# Patient Record
Sex: Male | Born: 1980 | Hispanic: Yes | Marital: Married | Smoking: Never smoker
Health system: Southern US, Community
[De-identification: ages and names within clinical notes are randomized; demographics above are authoritative.]

## PROBLEM LIST (undated history)

## (undated) DIAGNOSIS — K219 Gastro-esophageal reflux disease without esophagitis: Secondary | ICD-10-CM

## (undated) DIAGNOSIS — M6282 Rhabdomyolysis: Secondary | ICD-10-CM

## (undated) DIAGNOSIS — J189 Pneumonia, unspecified organism: Secondary | ICD-10-CM

## (undated) DIAGNOSIS — Z923 Personal history of irradiation: Secondary | ICD-10-CM

## (undated) DIAGNOSIS — Z9289 Personal history of other medical treatment: Secondary | ICD-10-CM

## (undated) DIAGNOSIS — C719 Malignant neoplasm of brain, unspecified: Secondary | ICD-10-CM

## (undated) DIAGNOSIS — R569 Unspecified convulsions: Secondary | ICD-10-CM

## (undated) DIAGNOSIS — F419 Anxiety disorder, unspecified: Secondary | ICD-10-CM

## (undated) DIAGNOSIS — D496 Neoplasm of unspecified behavior of brain: Secondary | ICD-10-CM

## (undated) HISTORY — DX: Anxiety disorder, unspecified: F41.9

## (undated) HISTORY — DX: Pneumonia, unspecified organism: J18.9

## (undated) HISTORY — DX: Gastro-esophageal reflux disease without esophagitis: K21.9

## (undated) HISTORY — DX: Malignant neoplasm of brain, unspecified: C71.9

## (undated) HISTORY — DX: Rhabdomyolysis: M62.82

## (undated) HISTORY — DX: Unspecified convulsions: R56.9

## (undated) HISTORY — DX: Personal history of other medical treatment: Z92.89

## (undated) HISTORY — DX: Personal history of irradiation: Z92.3

---

## 2010-09-19 DIAGNOSIS — Z9289 Personal history of other medical treatment: Secondary | ICD-10-CM

## 2010-09-19 DIAGNOSIS — R569 Unspecified convulsions: Secondary | ICD-10-CM

## 2010-09-19 HISTORY — DX: Unspecified convulsions: R56.9

## 2010-09-19 HISTORY — DX: Personal history of other medical treatment: Z92.89

## 2010-10-22 DIAGNOSIS — C719 Malignant neoplasm of brain, unspecified: Secondary | ICD-10-CM

## 2010-10-22 HISTORY — PX: BRAIN SURGERY: SHX531

## 2010-10-22 HISTORY — DX: Malignant neoplasm of brain, unspecified: C71.9

## 2012-04-19 DIAGNOSIS — J189 Pneumonia, unspecified organism: Secondary | ICD-10-CM

## 2012-04-19 HISTORY — DX: Pneumonia, unspecified organism: J18.9

## 2012-05-30 HISTORY — PX: CRANIOTOMY: SHX93

## 2012-06-11 ENCOUNTER — Emergency Department (HOSPITAL_COMMUNITY)
Admission: EM | Admit: 2012-06-11 | Discharge: 2012-06-11 | Disposition: A | Discharge status: Nursing Home (Includes ICF) | Payer: PRIVATE HEALTH INSURANCE | Source: Home / Self Care | Attending: Family Medicine | Admitting: Family Medicine

## 2012-06-11 ENCOUNTER — Encounter (HOSPITAL_COMMUNITY): Payer: Self-pay

## 2012-06-11 ENCOUNTER — Emergency Department (HOSPITAL_COMMUNITY)
Admission: EM | Admit: 2012-06-11 | Discharge: 2012-06-11 | Disposition: A | Discharge status: Home / Self Care | Payer: PRIVATE HEALTH INSURANCE | Attending: Emergency Medicine | Admitting: Emergency Medicine

## 2012-06-11 ENCOUNTER — Emergency Department (HOSPITAL_COMMUNITY): Payer: PRIVATE HEALTH INSURANCE

## 2012-06-11 ENCOUNTER — Emergency Department (INDEPENDENT_AMBULATORY_CARE_PROVIDER_SITE_OTHER): Payer: PRIVATE HEALTH INSURANCE

## 2012-06-11 ENCOUNTER — Encounter (HOSPITAL_COMMUNITY): Payer: Self-pay | Admitting: *Deleted

## 2012-06-11 DIAGNOSIS — R109 Unspecified abdominal pain: Secondary | ICD-10-CM | POA: Insufficient documentation

## 2012-06-11 DIAGNOSIS — R1032 Left lower quadrant pain: Secondary | ICD-10-CM

## 2012-06-11 DIAGNOSIS — D72829 Elevated white blood cell count, unspecified: Secondary | ICD-10-CM | POA: Insufficient documentation

## 2012-06-11 HISTORY — DX: Neoplasm of unspecified behavior of brain: D49.6

## 2012-06-11 LAB — CBC WITH DIFFERENTIAL/PLATELET
Basophils Absolute: 0 10*3/uL (ref 0.0–0.1)
Lymphs Abs: 2.2 10*3/uL (ref 0.7–4.0)
MCH: 27.9 pg (ref 26.0–34.0)
MCHC: 33.6 g/dL (ref 30.0–36.0)
MCV: 83.1 fL (ref 78.0–100.0)
Monocytes Absolute: 1.2 10*3/uL — ABNORMAL HIGH (ref 0.1–1.0)
Platelets: 350 10*3/uL (ref 150–400)
RDW: 14 % (ref 11.5–15.5)

## 2012-06-11 LAB — URINALYSIS, ROUTINE W REFLEX MICROSCOPIC
Glucose, UA: NEGATIVE mg/dL
Leukocytes, UA: NEGATIVE
Nitrite: NEGATIVE
Protein, ur: NEGATIVE mg/dL
Urobilinogen, UA: 0.2 mg/dL (ref 0.0–1.0)

## 2012-06-11 LAB — COMPREHENSIVE METABOLIC PANEL
Albumin: 3.6 g/dL (ref 3.5–5.2)
BUN: 18 mg/dL (ref 6–23)
Chloride: 96 mEq/L (ref 96–112)
Creatinine, Ser: 0.79 mg/dL (ref 0.50–1.35)
Total Bilirubin: 0.3 mg/dL (ref 0.3–1.2)

## 2012-06-11 LAB — LIPASE, BLOOD: Lipase: 84 U/L — ABNORMAL HIGH (ref 11–59)

## 2012-06-11 MED ORDER — MORPHINE SULFATE 4 MG/ML IJ SOLN
4.0000 mg | Freq: Once | INTRAMUSCULAR | Status: AC
  Administered 2012-06-11: 4 mg via INTRAVENOUS
  Filled 2012-06-11: qty 1

## 2012-06-11 MED ORDER — SODIUM CHLORIDE 0.9 % IV BOLUS (SEPSIS)
1000.0000 mL | Freq: Once | INTRAVENOUS | Status: AC
  Administered 2012-06-11: 1000 mL via INTRAVENOUS

## 2012-06-11 MED ORDER — ONDANSETRON HCL 4 MG/2ML IJ SOLN
4.0000 mg | Freq: Once | INTRAMUSCULAR | Status: AC
  Administered 2012-06-11: 4 mg via INTRAVENOUS
  Filled 2012-06-11: qty 2

## 2012-06-11 MED ORDER — IOHEXOL 300 MG/ML  SOLN
100.0000 mL | Freq: Once | INTRAMUSCULAR | Status: AC | PRN
  Administered 2012-06-11: 100 mL via INTRAVENOUS

## 2012-06-11 MED ORDER — ONDANSETRON HCL 4 MG PO TABS: 4.0000 mg | ORAL_TABLET | Freq: Four times a day (QID) | ORAL | Status: AC

## 2012-06-11 NOTE — ED Notes (Signed)
Abd pain

## 2012-06-11 NOTE — ED Notes (Signed)
Pt c/o left sided abd pain X 4 days. Pt reports he has been constipated but just took a laxative and was able to have a BM yesterday. Pt also c/o some left sided flank pain.

## 2012-06-11 NOTE — ED Notes (Signed)
Pt ambulated to restroom with no assistance. ?

## 2012-06-11 NOTE — ED Notes (Signed)
Sent from [facility] for abd pain on lower left side of abd, onset 4 days ago. No findings at [facility].

## 2012-06-11 NOTE — ED Notes (Signed)
Pt ambulated with no assistance down the hall. Reports it is painful to put pressure on his right leg.

## 2012-06-11 NOTE — ED Provider Notes (Signed)
History     CSN: [id number]  Arrival date & time 06/11/12  1256   First MD Initiated Contact with Patient 06/11/12 1306      Chief Complaint  Patient presents with  . Abdominal Pain    (Consider location/radiation/quality/duration/timing/severity/associated sxs/prior treatment) Patient is a 31 y.o. male presenting with abdominal pain. The history is provided by the patient and a relative.  Abdominal Pain The primary symptoms of the illness include abdominal pain, nausea and vomiting. The primary symptoms of the illness do not include fever or diarrhea. 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 The onset of the illness was gradual. The problem has been gradually worsening.  The patient has had a change in bowel habit. Additional symptoms associated with the illness include anorexia and constipation. Symptoms associated with the illness do not include back pain.    Past Medical History  Diagnosis Date  . Brain tumor     Past Surgical History  Procedure Date  . Brain surgery     No family history on file.  History  Substance Use Topics  . Smoking status: Not on file  . Smokeless tobacco: Not on file  . Alcohol Use:       Review of Systems  Constitutional: Positive for appetite change. Negative for fever.  Gastrointestinal: Positive for nausea, vomiting, abdominal pain, constipation and anorexia. Negative for diarrhea.  Genitourinary: Negative.   Musculoskeletal: Negative for back pain.    Allergies  Dilantin  Home Medications   Current Outpatient Rx  Name Route Sig Dispense Refill  . ACETAMINOPHEN PO Oral Take by mouth.    . BUTABARBITAL SODIUM PO Oral Take by mouth.    Marland Kitchen CIPRO PO Oral Take by mouth.    . DEXAMETHASONE (PAK) PO Oral Take by mouth.    Marland Kitchen KEPPRA PO Oral Take by mouth.    . OXYCODONE HCL PO Oral Take by mouth.    Marland Kitchen PANTOPRAZOLE SODIUM 40 MG IV  SOLR Intravenous Inject 40 mg into the vein.    . SENNA CO Combination by Combination route.      BP 130/97  Pulse 82  Temp 98.6 F (37 C) (Oral)  Resp 18  SpO2 100%  Physical Exam  Nursing note and vitals reviewed. Constitutional: He is oriented to person, place, and time. He appears well-developed and well-nourished.  Pulmonary/Chest: Breath sounds normal.  Abdominal: Soft. Bowel sounds are normal. He exhibits no distension and no mass. There is no hepatosplenomegaly. There is tenderness in the left upper quadrant and left lower quadrant. There is no rigidity, no rebound, no guarding and no CVA tenderness.  Neurological: He is alert and oriented to person, place, and time.  Skin: Skin is warm and dry.    ED Course  Procedures (including critical care time)  Labs Reviewed - No data to display Dg Abd 1 View  06/11/2012  *RADIOLOGY REPORT*  Clinical Data: Left abdominal pain for 4 days.  Recent brain surgery.  ABDOMEN - 1 VIEW  Comparison: None.  Findings: The bowel gas pattern appears nonobstructive.  There is mildly prominent stool within the right colon.  There is no supine evidence of free intraperitoneal air.  There is a small right pelvic calcification which is likely phleboliths.  Osseous structures appear normal.  IMPRESSION: No acute abdominal findings.   Original Report Authenticated By: Gerrianne Scale, M.D.  1. Abdominal pain, left lower quadrant       MDM  X-rays reviewed and report per radiologist.         Linna Hoff, MD 06/11/12 [phone]

## 2012-06-11 NOTE — ED Notes (Signed)
Pt  Reports   Symptoms  Of  abd  Pain      X  3 days     He  Has  Been  Taking  Oxycodone  For  ghis  Post op  Pain  -  He  Has  Had  Some  Constipation as  Of  Late       And  Has  Been taking some  Laxatives         He  Called  His   Surgeon who  Told  Them to  Get an x  Ray of  His  abd

## 2012-06-11 NOTE — ED Provider Notes (Signed)
History     CSN: [id number]  Arrival date & time 06/11/12  1451   First MD Initiated Contact with Patient 06/11/12 1903      Chief Complaint  Patient presents with  . Abdominal Pain    (Consider location/radiation/quality/duration/timing/severity/associated sxs/prior treatment) HPI  31 year old male who was sent from urgent care for further evaluation of his abdominal pain. Patient endorse a gradual onset of left side abdominal pain for the past 4 days. Pain is described as cramping, persistent, radiates to back.  Nothing aggravates or alleviate sxs.   He endorsed nausea, vomiting, and constipation. Has been using stool softener and were able to produce normal BM yesterday.  Has hx of acid reflux but sts this pain felt different.  Denies fever, headache, cp, sob, urinary sxs or rash.  No prior abd surgery.  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    Past Medical History  Diagnosis Date  . Brain tumor     Past Surgical History  Procedure Date  . Brain surgery     No family history on file.  History  Substance Use Topics  . Smoking status: Not on file  . Smokeless tobacco: Not on file  . Alcohol Use:       Review of Systems  All other systems reviewed and are negative.    Allergies  Dilantin  Home Medications   Current Outpatient Rx  Name Route Sig Dispense Refill  . ACETAMINOPHEN PO Oral Take by mouth.    . BUTABARBITAL SODIUM PO Oral Take by mouth.    Marland Kitchen CIPRO PO Oral Take by mouth.    . DEXAMETHASONE (PAK) PO Oral Take by mouth.    Marland Kitchen KEPPRA PO Oral Take by mouth.    . OXYCODONE HCL PO Oral Take by mouth.    Marland Kitchen PANTOPRAZOLE SODIUM 40 MG IV SOLR Intravenous Inject 40 mg into the vein.    . SENNA CO Combination by Combination route.      BP 134/99  Pulse 90  Temp 99.5 F (37.5 C) (Oral)  Resp 16  SpO2 96%  Physical Exam  Nursing note and vitals reviewed. Constitutional: He is oriented to person, place, and time. He  appears well-developed and well-nourished. No distress.       Awake, alert, nontoxic appearance  HENT:  Head: Normocephalic.       Well healing surgical scar along scalp line anteriorly, no evidence of infection, sutures in place  Faint ecchymosis behind R ear (battle sign), nontender  Eyes: Conjunctivae and EOM are normal. Pupils are equal, round, and reactive to light. Right eye exhibits no discharge. Left eye exhibits no discharge.  Neck: Normal range of motion. Neck supple.  Cardiovascular: Normal rate and regular rhythm.   Pulmonary/Chest: Effort normal. No respiratory distress. He exhibits no tenderness.  Abdominal: Soft. There is tenderness. There is no rigidity, no rebound, no guarding, no CVA tenderness, no tenderness at McBurney's point and negative Murphy's sign. No hernia.    Musculoskeletal: He exhibits no tenderness.       Cervical back: Normal.       Thoracic back: Normal.       Lumbar back: Normal.       Back:       ROM appears intact, no obvious focal weakness.  No midline spine tenderness, no step-off, no rash or overlying skin changes.  Lymphadenopathy:    He has no cervical adenopathy.  Neurological: He is alert and oriented to  person, place, and time. He has normal strength. No cranial nerve deficit or sensory deficit. He exhibits normal muscle tone. He displays a negative Romberg sign. Coordination and gait normal. GCS eye subscore is 4. GCS verbal subscore is 5. GCS motor subscore is 6.  Reflex Scores:      Patellar reflexes are 2+ on the right side and 2+ on the left side. Skin: Skin is warm and dry. No rash noted.  Psychiatric: He has a normal mood and affect.    ED Course  Procedures (including critical care time)  Labs Reviewed  CBC WITH DIFFERENTIAL - Abnormal; Notable for the following:    WBC 19.9 (*)     Neutrophils Relative 83 (*)     Lymphocytes Relative 11 (*)     Neutro Abs 16.5 (*)     Monocytes Absolute 1.2 (*)     All other components  within normal limits  COMPREHENSIVE METABOLIC PANEL  URINALYSIS, ROUTINE W REFLEX MICROSCOPIC   Dg Abd 1 View  06/11/2012  *RADIOLOGY REPORT*  Clinical Data: Left abdominal pain for 4 days.  Recent brain surgery.  ABDOMEN - 1 VIEW  Comparison: None.  Findings: The bowel gas pattern appears nonobstructive.  There is mildly prominent stool within the right colon.  There is no supine evidence of free intraperitoneal air.  There is a small right pelvic calcification which is likely phleboliths.  Osseous structures appear normal.  IMPRESSION: No acute abdominal findings.   Original Report Authenticated By: Gerrianne Scale, M.D.      No diagnosis found.  Results for orders placed during the hospital encounter of 06/11/12  CBC WITH DIFFERENTIAL      Component Value Range   WBC 19.9 (*) 4.0 - 10.5 K/uL   RBC 5.27  4.22 - 5.81 MIL/uL   Hemoglobin 14.7  13.0 - 17.0 g/dL   HCT 16.1  09.6 - 04.5 %   MCV 83.1  78.0 - 100.0 fL   MCH 27.9  26.0 - 34.0 pg   MCHC 33.6  30.0 - 36.0 g/dL   RDW 40.9  81.1 - 91.4 %   Platelets 350  150 - 400 K/uL   Neutrophils Relative 83 (*) 43 - 77 %   Lymphocytes Relative 11 (*) 12 - 46 %   Monocytes Relative 6  3 - 12 %   Eosinophils Relative 0  0 - 5 %   Basophils Relative 0  0 - 1 %   Neutro Abs 16.5 (*) 1.7 - 7.7 K/uL   Lymphs Abs 2.2  0.7 - 4.0 K/uL   Monocytes Absolute 1.2 (*) 0.1 - 1.0 K/uL   Eosinophils Absolute 0.0  0.0 - 0.7 K/uL   Basophils Absolute 0.0  0.0 - 0.1 K/uL   RBC Morphology POLYCHROMASIA PRESENT     WBC Morphology MILD LEFT SHIFT (1-5% METAS, OCC MYELO, OCC BANDS)    COMPREHENSIVE METABOLIC PANEL      Component Value Range   Sodium 135  135 - 145 mEq/L   Potassium 4.3  3.5 - 5.1 mEq/L   Chloride 96  96 - 112 mEq/L   CO2 29  19 - 32 mEq/L   Glucose, Bld 98  70 - 99 mg/dL   BUN 18  6 - 23 mg/dL   Creatinine, Ser 7.82  0.50 - 1.35 mg/dL   Calcium 9.1  8.4 - 95.6 mg/dL   Total Protein 7.1  6.0 - 8.3 g/dL   Albumin 3.6  3.5 - 5.2  g/dL     AST 18  0 - 37 U/L   ALT 38  0 - 53 U/L   Alkaline Phosphatase 76  39 - 117 U/L   Total Bilirubin 0.3  0.3 - 1.2 mg/dL   GFR calc non Af Amer >90  >90 mL/min   GFR calc Af Amer >90  >90 mL/min  URINALYSIS, ROUTINE W REFLEX MICROSCOPIC      Component Value Range   Color, Urine YELLOW  YELLOW   APPearance CLEAR  CLEAR   Specific Gravity, Urine 1.018  1.005 - 1.030   pH 5.5  5.0 - 8.0   Glucose, UA NEGATIVE  NEGATIVE mg/dL   Hgb urine dipstick NEGATIVE  NEGATIVE   Bilirubin Urine NEGATIVE  NEGATIVE   Ketones, ur NEGATIVE  NEGATIVE mg/dL   Protein, ur NEGATIVE  NEGATIVE mg/dL   Urobilinogen, UA 0.2  0.0 - 1.0 mg/dL   Nitrite NEGATIVE  NEGATIVE   Leukocytes, UA NEGATIVE  NEGATIVE  LIPASE, BLOOD      Component Value Range   Lipase 84 (*) 11 - 59 U/L   Dg Abd 1 View  06/11/2012  *RADIOLOGY REPORT*  Clinical Data: Left abdominal pain for 4 days.  Recent brain surgery.  ABDOMEN - 1 VIEW  Comparison: None.  Findings: The bowel gas pattern appears nonobstructive.  There is mildly prominent stool within the right colon.  There is no supine evidence of free intraperitoneal air.  There is a small right pelvic calcification which is likely phleboliths.  Osseous structures appear normal.  IMPRESSION: No acute abdominal findings.   Original Report Authenticated By: Gerrianne Scale, M.D.    Ct Abdomen Pelvis W Contrast  06/11/2012  *RADIOLOGY REPORT*  Clinical Data: Left-sided abdominal pain.  CT ABDOMEN AND PELVIS WITH CONTRAST  Technique:  Multidetector CT imaging of the abdomen and pelvis was performed following the standard protocol during bolus administration of intravenous contrast.  Contrast: OMNIPAQUE IOHEXOL 300 MG/ML  SOLN  Comparison: 06/11/2012 radiograph  Findings: Limited images through the lung bases demonstrate trace pericardial fluid versus thickening.  Normal heart size.  Lung bases are clear.  Unremarkable liver, biliary system, spleen, pancreas, adrenal glands.  Symmetric  renal enhancement.  Minimal left perinephric fat stranding.  No hydronephrosis or hydroureter.  No ureteral calculi identified.  No bowel obstruction.  No CT evidence for colitis.  Normal appendix.  No free intraperitoneal air or fluid.  Mild right iliac artery atherosclerosis.  Normal caliber aorta.  Thin-walled bladder.  Soft tissue attenuation within the inguinal canals presumably represent high riding testicles.  Correlate with physical examination.  No acute osseous finding. Small fat containing anterior abdominal wall/periumbilical hernia.  Impression:  Minimal left perinephric fat stranding.  Correlate with urinalysis if concerned for ascending infection. Alternatively, this may reflect sequelae of prior inflammation (such as from a previously passed stone).  Despite the elevated lipase, homogeneous pancreatic enhancement, without CT evidence of acute inflammation.  Soft tissue attenuation within the inguinal canals may correspond to high-riding testicles.  Correlate with physical examination.   Original Report Authenticated By: Waneta Martins, M.D.     1. Leukocytosis 2. abd pain 2/2 recently passed L side kidney stone  MDM  Date left-sided abdominal discomfort x4 days.  Abdominal exam is unremarkable.  Pt has no focal neuro deficits.  White count is elevated at 19.9 with a left shift. However patient has a recent surgical procedure (brain surgery) 10 days ago so unsure if leukocytosis is related  to previous surgery. Plan to obtain abd/pelvic CT scan for further evaluation.  Pain medication and antinausea medication given.    10:27 PM Abd/pelvic CT shows no acute finding.  There is evidence of minimal left perinephric fat stranding, which may reflect sequelae of prior inflammation from a previously passed stone.  Pt did report he has a 2.5 mm stone in L kidney which were found on previous ct scan.  I believe his presenting complaint is related to a recently passed stone.  Reassurance given.  I  recommend f/u with his doctor for further care, and to return if his sxs worsen.  Pt voice understanding and agrees with plan.  Pt able to tolerates PO.  Will d/c.    Fayrene Helper, PA-C 06/11/12 2237

## 2012-06-13 NOTE — ED Provider Notes (Signed)
Medical screening examination/treatment/procedure(s) were performed by non-physician practitioner and as supervising physician I was immediately available for consultation/collaboration.  Tobin Chad, MD 06/13/12 [phone]

## 2012-06-25 ENCOUNTER — Encounter: Payer: Self-pay | Admitting: *Deleted

## 2012-06-25 ENCOUNTER — Telehealth: Payer: Self-pay | Admitting: *Deleted

## 2012-06-25 DIAGNOSIS — J189 Pneumonia, unspecified organism: Secondary | ICD-10-CM | POA: Insufficient documentation

## 2012-06-25 NOTE — Telephone Encounter (Signed)
Pt;s brother called wanting to make an appt with Dr Donnald Garre on Monday.  Pt is not established with Dr Arbutus Ped at this time.  Informed him that he would need to be referred to Dr [facility] if medical oncology is appropriate.  He verbalized understanding.  SLJ

## 2012-06-28 ENCOUNTER — Encounter: Payer: Self-pay | Admitting: Radiation Oncology

## 2012-06-28 ENCOUNTER — Ambulatory Visit
Admission: RE | Admit: 2012-06-28 | Discharge: 2012-06-28 | Disposition: A | Discharge status: Home / Self Care | Payer: PRIVATE HEALTH INSURANCE | Source: Ambulatory Visit | Attending: Radiation Oncology | Admitting: Radiation Oncology

## 2012-06-28 ENCOUNTER — Telehealth: Payer: Self-pay | Admitting: Radiation Oncology

## 2012-06-28 ENCOUNTER — Ambulatory Visit: Payer: PRIVATE HEALTH INSURANCE | Admitting: Radiation Oncology

## 2012-06-28 VITALS — BP 133/90 | HR 80 | Temp 98.1°F | Resp 20 | Wt 192.9 lb

## 2012-06-28 DIAGNOSIS — C719 Malignant neoplasm of brain, unspecified: Secondary | ICD-10-CM | POA: Insufficient documentation

## 2012-06-28 DIAGNOSIS — Z79899 Other long term (current) drug therapy: Secondary | ICD-10-CM | POA: Insufficient documentation

## 2012-06-28 DIAGNOSIS — D72829 Elevated white blood cell count, unspecified: Secondary | ICD-10-CM | POA: Insufficient documentation

## 2012-06-28 DIAGNOSIS — R51 Headache: Secondary | ICD-10-CM | POA: Insufficient documentation

## 2012-06-28 DIAGNOSIS — K219 Gastro-esophageal reflux disease without esophagitis: Secondary | ICD-10-CM | POA: Insufficient documentation

## 2012-06-28 DIAGNOSIS — Z510 Encounter for antineoplastic radiation therapy: Secondary | ICD-10-CM | POA: Insufficient documentation

## 2012-06-28 DIAGNOSIS — C711 Malignant neoplasm of frontal lobe: Secondary | ICD-10-CM | POA: Insufficient documentation

## 2012-06-28 HISTORY — DX: Malignant neoplasm of brain, unspecified: C71.9

## 2012-06-28 LAB — CBC WITH DIFFERENTIAL/PLATELET
BASO%: 0.5 % (ref 0.0–2.0)
EOS%: 1.3 % (ref 0.0–7.0)
HGB: 13.3 g/dL (ref 13.0–17.1)
MCH: 28.4 pg (ref 27.2–33.4)
MCHC: 33.9 g/dL (ref 32.0–36.0)
RBC: 4.68 10*6/uL (ref 4.20–5.82)
RDW: 14.4 % (ref 11.0–14.6)
lymph#: 3.4 10*3/uL — ABNORMAL HIGH (ref 0.9–3.3)

## 2012-06-28 NOTE — Telephone Encounter (Signed)
CORRECTION ON EPP VALID DATES   APPROVED 100%  Family Size: 2  HH INC: 0  MOD POV: 11,490.00  Valid Dates: 06/28/2012 - 12/26/2012

## 2012-06-28 NOTE — Progress Notes (Signed)
Pt denies headache, has occasional nausea but not daily, is fatigued and rests majority of day. Denies vision problems, loss of appetite. Pt stopped Dexamethasone 06/11/12, states he had missed a few doses so dr stopped med. Pt occasionally has low mid back pain that radiates down his right leg, describes as dull, achy. Takes Oxy-IR and Ibuprofen prn. Pt seeing surgeon tomorrow to have sticthes removed from front of scalp. Area clean, dry, intact.

## 2012-06-28 NOTE — Progress Notes (Signed)
Please see the Nurse Progress Note in the MD Initial Consult Encounter for this patient. 

## 2012-06-28 NOTE — Telephone Encounter (Signed)
Met with patient and his brother today in regards to [employer] Pt is married [place]), not a [place] Citizen and has no valid SSN. Pt will not qualify for MCD at this time.  Pt completed the EPP application with letter of support from his brother Ignacia Felling [place]).  APPROVED 100% Family Size: 2 HH INC: 0 MOD POV: 11,490.00 Valid Dates: 06/28/2012 - 06/28/2013 *PLEASE APPLY DISCOUNT TO ANY PRIOR AND ALL CURRENT BILL DOS.  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 Kathrynn Running  Rad Tx:

## 2012-06-28 NOTE — Progress Notes (Signed)
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  He underwent brain surgery on 10/22/2010 in [place] for resection of a right frontal grade II astrocytoma. In follow-up.  He relocated to the [place] to visit family and began experiencing some headaches.  He was seen at [facility] in [place].  A brain MRI on 04/05/12 demonstrated a 5.0 x 5.0 cm region of T2 hyperintensity.  He elected to see the brain tumor team at [facility].  He underwent BrainLab image guidance MRI with fiducials on 8/11 and image-guided right frontal craniotomy on 05/31/2012 with Dr. Lavonna Monarch at [facility] and resection was performed.  Post-operative imaging included a non-contrast head CT performed on 8/16 for headaches, which showed the resection cavity.   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  Following hospital discharge, the patient developed acute abdominal pain and presented to the [facility] emergency room. Abdominal CT at that time showed no overt etiology other than some minimal left perinephric fat stranding.  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 He was given a two-week course of antibiotics and antifungal therapy. The patient has, been referred today for discussion of potential postoperative radiation treatment.   PREVIOUS RADIATION THERAPY: No  PAST MEDICAL HISTORY:  has a past medical history of Brain tumor; Brain cancer (10/22/10); Pneumonia (04/2012); Seizures (09/2010);  Rhabdomyolysis; History of hemodialysis (09/2010); GERD (gastroesophageal reflux disease); and Anxiety.    PAST SURGICAL HISTORY: Past Surgical History  Procedure Date  . Brain surgery 10/22/2010    [place]  . Craniotomy 05/30/12    Dr Lavonna Monarch, [facility]    FAMILY HISTORY: family history includes Diabetes in his father and mother; Hyperlipidemia in his mother; and Hypertension in his father and mother.  SOCIAL HISTORY:  reports that he has never smoked. He does not have any smokeless tobacco history on file. He reports that he does not drink alcohol or use illicit drugs.  ALLERGIES: Dilantin  MEDICATIONS:  Current Outpatient Prescriptions  Medication Sig Dispense Refill  . acetaminophen (TYLENOL) 650 MG CR tablet Take 650 mg by mouth every 4 (four) hours as needed. For pain/fever      . bacitracin ointment Apply 1 application topically daily.      . butalbital-acetaminophen-caffeine (FIORICET) 50-325-40 MG per tablet Take 1 tablet by mouth every 4 (four) hours as needed. For pain      . ciprofloxacin (CIPRO) 750 MG tablet Take 750 mg by mouth every 12 (twelve) hours.      Marland Kitchen dexamethasone (DECADRON) 4 MG tablet Take 4 mg by mouth See admin instructions. Take 1 tab 4 times daily for 5 days; then 1 tab 3 times daily for 1 day; then 1 tab daily 2 times daily for 1 day; then 0.5 half tab 2 times daily for 1 day; then 0.5 half tab once daily for 1 day; then stop; Start date 06/01/12      . levETIRAcetam (KEPPRA) 500 MG tablet Take 500-1,000 mg by mouth 2 (two) times daily. 2 tabs  2 times daily for 10 days, Start date 06/01/12; If no seizures decrease to 1 tab 2 times daily until follow up appt      . ondansetron (ZOFRAN) 4 MG tablet Take 4 mg by mouth every 8 (eight) hours as needed.      Marland Kitchen oxyCODONE (OXY IR/ROXICODONE) 5 MG immediate release tablet Take 5 mg by mouth every 4 (four) hours as needed. For pain      . pantoprazole (PROTONIX) 40 MG tablet Take 40 mg by mouth daily.      Marland Kitchen  senna-docusate (SENOKOT-S) 8.6-50 MG per tablet Take 2 tablets by mouth 2 (two) times daily.      Marland Kitchen voriconazole (VFEND) 200 MG tablet Take 200 mg by mouth every 12 (twelve) hours.        REVIEW OF SYSTEMS:  A 15 point review of systems is documented in the electronic medical record. This was obtained by the nursing staff. However, I reviewed this with the patient to discuss relevant findings and make appropriate changes.  A comprehensive review of systems was negative.   PHYSICAL EXAM: The patient is in no acute distress today. Is alert and oriented.  Filed Vitals:   06/28/12 0915  BP: 133/90  Pulse: 80  Temp: 98.1 F (36.7 C)  Resp: 20  His head is normocephalic with a recent right frontal craniotomy incision which is well attended. Extraocular muscles are intact motor strength is intact throughout. Speech is fluent articulate gait is unremarkable   LABORATORY DATA:  Lab Results  Component Value Date   WBC 19.9* 06/11/2012   HGB 14.7 06/11/2012   HCT 43.8 06/11/2012   MCV 83.1 06/11/2012   PLT 350 06/11/2012   Lab Results  Component Value Date   NA 135 06/11/2012   K 4.3 06/11/2012   CL 96 06/11/2012   CO2 29 06/11/2012   Lab Results  Component Value Date   ALT 38 06/11/2012   AST 18 06/11/2012   ALKPHOS 76 06/11/2012   BILITOT 0.3 06/11/2012     RADIOGRAPHY: Dg Abd 1 View  06/11/2012  *RADIOLOGY REPORT*  Clinical Data: Left abdominal pain for 4 days.  Recent brain surgery.  ABDOMEN - 1 VIEW  Comparison: None.  Findings: The bowel gas pattern appears nonobstructive.  There is mildly prominent stool within the right colon.  There is no supine evidence of free intraperitoneal air.  There is a small right pelvic calcification which is likely phleboliths.  Osseous structures appear normal.  IMPRESSION: No acute abdominal findings.   Original Report Authenticated By: Gerrianne Scale, M.D.    Ct Abdomen Pelvis W Contrast  06/11/2012  *RADIOLOGY REPORT*  Clinical Data: Left-sided  abdominal pain.  CT ABDOMEN AND PELVIS WITH CONTRAST  Technique:  Multidetector CT imaging of the abdomen and pelvis was performed following the standard protocol during bolus administration of intravenous contrast.  Contrast: OMNIPAQUE IOHEXOL 300 MG/ML  SOLN  Comparison: 06/11/2012 radiograph  Findings: Limited images through the lung bases demonstrate trace pericardial fluid versus thickening.  Normal heart size.  Lung bases are clear.  Unremarkable liver, biliary system, spleen, pancreas, adrenal glands.  Symmetric renal enhancement.  Minimal left perinephric fat stranding.  No hydronephrosis or hydroureter.  No ureteral calculi identified.  No bowel obstruction.  No CT evidence for colitis.  Normal appendix.  No free intraperitoneal air or fluid.  Mild right iliac artery atherosclerosis.  Normal caliber aorta.  Thin-walled bladder.  Soft tissue attenuation within the inguinal canals presumably  represent high riding testicles.  Correlate with physical examination.  No acute osseous finding. Small fat containing anterior abdominal wall/periumbilical hernia.  Impression:  Minimal left perinephric fat stranding.  Correlate with urinalysis if concerned for ascending infection. Alternatively, this may reflect sequelae of prior inflammation (such as from a previously passed stone).  Despite the elevated lipase, homogeneous pancreatic enhancement, without CT evidence of acute inflammation.  Soft tissue attenuation within the inguinal canals may correspond to high-riding testicles.  Correlate with physical examination.   Original Report Authenticated By: Waneta Martins, M.D.       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  He would benefit from adjuvant radiotherapy with concurrent Temodar.  Plan: Today, talked the patient about his recent surgery and potential treatment options. We talked about the natural history of brain  tumors we talked about the role of surgery and radiation. We also talked briefly about the role of concurrent Temodar chemotherapy. We discussed the logistics and delivery of radiotherapy. We talked about the anticipated acute and late sequelae. I filled out a patient counseling form for him with relevance treatment diagrams and we retained a copy for our records. The patient is interested in proceeding with adjuvant radiation as discussed.  I will work on scheduling CT simulation as well as consultation with medical oncology. I spent 60 minutes minutes face to face with the patient and more than 50% of that time was spent in counseling and/or coordination of care.   ------------------------------------------------  Artist Pais. Kathrynn Running, M.D.

## 2012-06-28 NOTE — Progress Notes (Signed)
Quick Note:  Please call patient with normal result.  Thanks. MM ______ 

## 2012-06-29 ENCOUNTER — Encounter: Payer: Self-pay | Admitting: Medical Oncology

## 2012-06-29 ENCOUNTER — Telehealth: Payer: Self-pay | Admitting: *Deleted

## 2012-06-29 NOTE — Addendum Note (Signed)
Encounter addended by: Glennie Hawk, RN on: 06/29/2012 10:07 AM<BR>     Documentation filed: Charges VN

## 2012-06-29 NOTE — Telephone Encounter (Signed)
Spoke w/pt's brother; corrected home phone # in [place]. Gave him verbal per Dr Kathrynn Running that pt's CBC was normal. Per his request, faxed lab result to Dr Pecola Lawless, [facility], [phone].

## 2012-06-30 ENCOUNTER — Other Ambulatory Visit: Payer: Self-pay | Admitting: Radiation Therapy

## 2012-06-30 ENCOUNTER — Telehealth: Payer: Self-pay | Admitting: Oncology

## 2012-06-30 ENCOUNTER — Ambulatory Visit (HOSPITAL_COMMUNITY)
Admission: RE | Admit: 2012-06-30 | Discharge: 2012-06-30 | Disposition: A | Discharge status: Home / Self Care | Payer: PRIVATE HEALTH INSURANCE | Source: Ambulatory Visit | Attending: Radiation Oncology | Admitting: Radiation Oncology

## 2012-06-30 ENCOUNTER — Telehealth: Payer: Self-pay | Admitting: *Deleted

## 2012-06-30 DIAGNOSIS — C719 Malignant neoplasm of brain, unspecified: Secondary | ICD-10-CM

## 2012-06-30 DIAGNOSIS — G936 Cerebral edema: Secondary | ICD-10-CM | POA: Insufficient documentation

## 2012-06-30 MED ORDER — GADOBENATE DIMEGLUMINE 529 MG/ML IV SOLN
18.0000 mL | Freq: Once | INTRAVENOUS | Status: AC | PRN
  Administered 2012-06-30: 18 mL via INTRAVENOUS

## 2012-06-30 NOTE — Telephone Encounter (Signed)
CALLED PATIENT TO INFORM OF TEST FOR 06-30-12 AT 9:00 PM, SPOKE WITH PATIENT'S BROTHER AND THEY ARE AWARE OF THIS TEST

## 2012-06-30 NOTE — Telephone Encounter (Signed)
S/w the pt's brother and he is aware of the appt on 07/01/2012

## 2012-07-01 ENCOUNTER — Ambulatory Visit: Payer: PRIVATE HEALTH INSURANCE

## 2012-07-01 ENCOUNTER — Ambulatory Visit: Payer: PRIVATE HEALTH INSURANCE | Admitting: Oncology

## 2012-07-02 ENCOUNTER — Encounter: Payer: Self-pay | Admitting: Radiation Oncology

## 2012-07-02 ENCOUNTER — Ambulatory Visit
Admission: RE | Admit: 2012-07-02 | Discharge: 2012-07-02 | Disposition: A | Discharge status: Home / Self Care | Payer: PRIVATE HEALTH INSURANCE | Source: Ambulatory Visit | Attending: Radiation Oncology | Admitting: Radiation Oncology

## 2012-07-02 DIAGNOSIS — C719 Malignant neoplasm of brain, unspecified: Secondary | ICD-10-CM

## 2012-07-04 ENCOUNTER — Encounter: Payer: Self-pay | Admitting: Radiation Oncology

## 2012-07-04 NOTE — Progress Notes (Addendum)
  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  Identity was confirmed.  All relevant records and images related to the planned course of therapy were reviewed.  The patient freely provided informed written consent to proceed with treatment after reviewing the details related to the planned course of therapy. The consent form was witnessed and verified by the simulation staff.  Then, the patient was set-up in a stable reproducible  supine position for radiation therapy.  CT images were obtained.  Surface markings were placed.  The CT images were loaded into the planning software.  Then the target and avoidance structures were contoured.  Treatment planning then occurred.  The radiation prescription was entered and confirmed.  A total of 1 complex treatment device was fabricated. I have requested : 3-D conformal radiotherapy planning using dynamic conformal arcs. Specifically, will require dose volume histograms for the target structures, brainstem, optic chiasm, optic nerve and uninvolved brain.  PLAN:  The patient will receive 59.4 Gy in 33 fractions.  ________________________________  Artist Pais Kathrynn Running, M.D.   Addendum: In reviewing this patient's planning MRI and CT, it appears that his surgical cavity in the right frontal lobe includes the frontal convexity overlying the right orbit. This brings the target structure into very close proximity with the right eye and optic nerve. This geometry cannot possibly be treated with 3-D conformal radiation with adequate coverage of the target volume while sparing the optic structures appropriately. Accordingly, the patient will require intensity  modulated radiotherapy and have requested a rapid ARC plan.

## 2012-07-06 ENCOUNTER — Other Ambulatory Visit (HOSPITAL_BASED_OUTPATIENT_CLINIC_OR_DEPARTMENT_OTHER): Payer: PRIVATE HEALTH INSURANCE | Admitting: Lab

## 2012-07-06 ENCOUNTER — Ambulatory Visit (HOSPITAL_BASED_OUTPATIENT_CLINIC_OR_DEPARTMENT_OTHER): Payer: PRIVATE HEALTH INSURANCE | Admitting: Oncology

## 2012-07-06 ENCOUNTER — Encounter: Payer: Self-pay | Admitting: Oncology

## 2012-07-06 ENCOUNTER — Other Ambulatory Visit: Payer: Self-pay | Admitting: *Deleted

## 2012-07-06 ENCOUNTER — Ambulatory Visit: Payer: PRIVATE HEALTH INSURANCE

## 2012-07-06 VITALS — BP 122/86 | HR 84 | Temp 98.2°F | Resp 20 | Ht 69.0 in | Wt 192.8 lb

## 2012-07-06 DIAGNOSIS — C719 Malignant neoplasm of brain, unspecified: Secondary | ICD-10-CM

## 2012-07-06 DIAGNOSIS — C711 Malignant neoplasm of frontal lobe: Secondary | ICD-10-CM

## 2012-07-06 LAB — CBC WITH DIFFERENTIAL/PLATELET
Basophils Absolute: 0 10*3/uL (ref 0.0–0.1)
EOS%: 0.6 % (ref 0.0–7.0)
HCT: 39.9 % (ref 38.4–49.9)
HGB: 13.4 g/dL (ref 13.0–17.1)
MCH: 28.1 pg (ref 27.2–33.4)
MCV: 83.7 fL (ref 79.3–98.0)
MONO%: 6.8 % (ref 0.0–14.0)
NEUT%: 45.8 % (ref 39.0–75.0)
RDW: 14.1 % (ref 11.0–14.6)

## 2012-07-06 LAB — COMPREHENSIVE METABOLIC PANEL (CC13)
AST: 13 U/L (ref 5–34)
Alkaline Phosphatase: 84 U/L (ref 40–150)
BUN: 11 mg/dL (ref 7.0–26.0)
Creatinine: 1 mg/dL (ref 0.7–1.3)
Total Bilirubin: 0.4 mg/dL (ref 0.20–1.20)

## 2012-07-06 MED ORDER — TEMOZOLOMIDE 20 MG PO CAPS: 20.0000 mg | ORAL_CAPSULE | Freq: Every day | ORAL | Status: DC

## 2012-07-06 MED ORDER — DEXAMETHASONE 4 MG PO TABS: 4.0000 mg | ORAL_TABLET | Freq: Two times a day (BID) | ORAL | Status: DC

## 2012-07-06 MED ORDER — TEMOZOLOMIDE 140 MG PO CAPS: 140.0000 mg | ORAL_CAPSULE | Freq: Every day | ORAL | Status: DC

## 2012-07-06 NOTE — Patient Instructions (Addendum)
We discussed you treatment plan. You will begin temodar on a daily basis starting the day before start of radiation therapy. Take 1 capsule of 140 mg and 20 mg capsule once a day starting on 9/23.   Take zofran 8 mg 1/2 hour prior to taking the temodar to help prevent nausea or vomiting  Take dexamethasone 4 mg (1 tablet) daily for 7 days then take 1/2 tablet daily  Until you see me again  Take Protonix daily   I will see you back on 10/10.  If you have any problems please call my nurses desk at [phone]  Temozolomide capsules What is this medicine? TEMOZOLOMIDE (te moe ZOE loe mide) is a chemotherapy drug. It is used to treat some kinds of brain cancer. This medicine may be used for other purposes; ask your health care provider or pharmacist if you have questions. What should I tell my health care provider before I take this medicine? They need to know if you have any of these conditions: -bleeding problems -infection (especially a virus infection such as chickenpox, cold sores, or herpes) -kidney disease -liver disease -recent or ongoing radiation therapy -an unusual or allergic reaction to temozolomide, dacarbazine (DTIC), other medicines, foods, dyes, or preservatives -pregnant or trying to get pregnant -breast-feeding How should I use this medicine? Take this medicine by mouth with a full glass of water. Do not chew, crush or open the capsules. You can take this medicine with or without food. However, you should always take it the same way each time. However, taking this medicine on an empty stomach may reduce nausea. Taking this medicine at bedtime may also reduce nausea. Follow the directions on the prescription label. Take your medicine at regular intervals. Do not take your medicine more often than directed. Do not stop taking except on your doctor's advice. Talk to your pediatrician regarding the use of this medicine in children. Special care may be needed. Overdosage: If you  think you have taken too much of this medicine contact a poison control center or emergency room at once. NOTE: This medicine is only for you. Do not share this medicine with others. What if I miss a dose? If you miss a dose, take it as soon as you can. If it is almost time for your next dose, take only that dose. Do not take double or extra doses. What may interact with this medicine? -vaccines -valproic acid This list may not describe all possible interactions. Give your health care provider a list of all the medicines, herbs, non-prescription drugs, or dietary supplements you use. Also tell them if you smoke, drink alcohol, or use illegal drugs. Some items may interact with your medicine. What should I watch for while using this medicine? This drug may make you feel generally unwell. This is not uncommon, as chemotherapy can affect healthy cells as well as cancer cells. Report any side effects. Continue your course of treatment even though you feel ill unless your doctor tells you to stop. Call your doctor or health care professional for advice if you get a fever, chills or sore throat, or other symptoms of a cold or flu. Call if you get diarrhea. Do not treat yourself. This drug decreases your body's ability to fight infections. Try to avoid being around people who are sick. This medicine may increase your risk to bruise or bleed. Call your doctor or health care professional if you notice any unusual bleeding. Be careful brushing and flossing your teeth or using  a toothpick because you may get an infection or bleed more easily. If you have any dental work done, tell your dentist you are receiving this medicine. Avoid taking products that contain aspirin, acetaminophen, ibuprofen, naproxen, or ketoprofen unless instructed by your doctor. These medicines may hide a fever. Do not open the capsules of this medicine. Breathing in or touching the powder in the capsule may be harmful. If the powder  accidentally gets on your skin, wash the area thoroughly. If you have difficulty swallowing, contact your doctor or health care professional. This drug may cause birth defects. Both men and women who are taking this medicine should use effective birth control. Do not become pregnant while taking this medicine. Women should inform their doctor if they wish to become pregnant or think they might be pregnant. There is a potential for serious side effects to an unborn child. Talk to your health care professional or pharmacist for more information. Do not breast-feed an infant while taking this medicine. What side effects may I notice from receiving this medicine? Side effects that you should report to your doctor or health care professional as soon as possible: -allergic reactions like skin rash, itching or hives, swelling of the face, lips, or tongue -low blood counts - temozolomide may decrease the number of white blood cells, red blood cells and platelets. You may be at increased risk for infections and bleeding. -signs of infection - fever or chills, cough, sore throat, pain or difficulty passing urine -signs of decreased platelets or bleeding - bruising, pinpoint red spots on the skin, black, tarry stools, blood in the urine -signs of decreased red blood cells - unusually weak or tired, fainting spells, lightheadedness -breathing problems -changes in vision -feeling faint or lightheaded, falls -memory problems -problems with balance, walking -seizures -trouble passing urine or change in the amount of urine -vomiting Side effects that usually do not require medical attention (report to your doctor or health care professional if they continue or are bothersome): -bad taste in the mouth -constipation -difficulty sleeping -dry skin -hair loss -headache -loss of appetite -nausea This list may not describe all possible side effects. Call your doctor for medical advice about side effects. You  may report side effects to FDA at 1-800-FDA-1088. Where should I keep my medicine? Keep out of the reach of children. Store at room temperature not above 25 degrees C (77 degrees F). Protect from moisture and heat. Throw away any unused medicine after the expiration date. NOTE: This sheet is a summary. It may not cover all possible information. If you have questions about this medicine, talk to your doctor, pharmacist, or health care provider.  2012, [employer]. (12/22/2007 10:35:49 AM)

## 2012-07-08 ENCOUNTER — Telehealth: Payer: Self-pay | Admitting: *Deleted

## 2012-07-08 NOTE — Telephone Encounter (Signed)
Gave patient appointment for 07-29-2012 starting at 9 times

## 2012-07-09 ENCOUNTER — Other Ambulatory Visit: Payer: Self-pay | Admitting: *Deleted

## 2012-07-09 ENCOUNTER — Telehealth: Payer: Self-pay | Admitting: *Deleted

## 2012-07-09 MED ORDER — TEMOZOLOMIDE 20 MG PO CAPS: 20.0000 mg | ORAL_CAPSULE | Freq: Every day | ORAL | Status: DC

## 2012-07-09 MED ORDER — TEMOZOLOMIDE 140 MG PO CAPS: 140.0000 mg | ORAL_CAPSULE | Freq: Every day | ORAL | Status: DC

## 2012-07-09 NOTE — Telephone Encounter (Signed)
Pt requested revised Rx for Temodar so brother could take to [facility] and pay out of pocket. Temodar Rx 140mg  Q-5 Temodar Rx 20mg  Q-5  Original Rx  Temodar Rx 140mg  Q-40 R-1 Temodar Rx 20mg  Q-40 R2 Re-entered into system as this will need to be faxed for authorization of medication.

## 2012-07-09 NOTE — Telephone Encounter (Signed)
Call from [place]. Pt will pay out of pocket at [facility] requesting q-5 of each mg of Temodar. Review with MD. New Rx printed.

## 2012-07-09 NOTE — Telephone Encounter (Signed)
Unable to reach pt; no vm available to notify pt of CBC results per Dr Broadus John instruction. Pt's brother called on 06/29/12 and was notified.

## 2012-07-12 ENCOUNTER — Encounter: Payer: Self-pay | Admitting: Oncology

## 2012-07-12 NOTE — Progress Notes (Signed)
CORRECTION::: Patient is approved for free temodar until 07/12/14.

## 2012-07-12 NOTE — Progress Notes (Signed)
Patient is approved for free temodar through [employer], [id number], until 12/31/3.  Refills should be called in 5-7 days before needed @ [id number] opt [id number].

## 2012-07-13 ENCOUNTER — Ambulatory Visit
Admission: RE | Admit: 2012-07-13 | Discharge: 2012-07-13 | Disposition: A | Discharge status: Home / Self Care | Payer: PRIVATE HEALTH INSURANCE | Source: Ambulatory Visit | Attending: Radiation Oncology | Admitting: Radiation Oncology

## 2012-07-13 DIAGNOSIS — C719 Malignant neoplasm of brain, unspecified: Secondary | ICD-10-CM

## 2012-07-13 NOTE — Progress Notes (Signed)
Post sim ed completed w/pt and spouse; gave pt "Radiation and You" booklet w/pertinent pages marked and discussed, re: headaches, hair loss/scalp care, fatigue, pain. Wife asks if pt may continue applying antibiotic ointment to scar on scalp. Advised if he does, he should wash off w/soap, water before tx daily. Scar is slightly pink on right side of head, is intact, no swelling noted.  Pt uses dandruff shampoo, wife states he "has lost sleep due to scalp itching from using baby shampoo". Advised pt may use dandruff shampoo to relieve itching; will discuss w/dr Friday. All questions answered. Wife states that ever since pt began taking Decadron again he has developed lower leg cramps nightly, x 5-6 nights. She states he had same issue  when he was put on Decadron after surgery. She states leg cramps went away when he stopped steroid. Pt was taking Ibuprofen until yesterday for leg cramps when wife noted Ibuprofen was contraindicated w/Temodar, according to information pt has at home. Advised pt he may take Tylenol for leg cramps.  Wife requests nutrition appt; will schedule and inform pt tomorrow.

## 2012-07-13 NOTE — Progress Notes (Signed)
  Radiation Oncology         [phone] ________________________________  Name: Brian Hanson MRN: [id number]  Date: 07/13/2012  DOB: [birth year]  Simulation Verification Note  Status: outpatient  NARRATIVE: The patient was brought to the treatment unit and placed in the planned treatment position. The clinical setup was verified. Then port films were obtained and uploaded to the radiation oncology medical record software.  The treatment beams were carefully compared against the planned radiation fields. The position location and shape of the radiation fields was reviewed. They targeted volume of tissue appears to be appropriately covered by the radiation beams. Organs at risk appear to be excluded as planned.  Based on my personal review, I approved the simulation verification. The patient's treatment will proceed as planned.  -----------------------------------  Billie Lade, PhD, MD

## 2012-07-14 ENCOUNTER — Telehealth: Payer: Self-pay | Admitting: *Deleted

## 2012-07-14 ENCOUNTER — Ambulatory Visit
Admission: RE | Admit: 2012-07-14 | Discharge: 2012-07-14 | Disposition: A | Discharge status: Home / Self Care | Payer: PRIVATE HEALTH INSURANCE | Source: Ambulatory Visit | Attending: Radiation Oncology | Admitting: Radiation Oncology

## 2012-07-14 NOTE — Telephone Encounter (Signed)
error 

## 2012-07-14 NOTE — Progress Notes (Signed)
[facility]  [phone] Fax:([phone]   MEDICAL ONCOLOGY - INITIAL CONSULATION    Referral MD: Dr. 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 Patient is seen for consideration of concurrent chemoradiation.   Chief Complaint  Patient presents with  . New Evaluation    Brain  : WHO grade 3 anaplastic astrocytoma.   HPI: Patient is a very pleasant 31 year old gentleman from [place] who developed seizures back in 2012. He subsequently underwent brain surgery on 10/22/2010 and [place] for resection of a right frontal grade 2 astrocytoma. He subsequently relocated to the [place] to visit family and during this visit he developed headaches. He at that time was in [place] tear he had MRI of the brain performed on 04/05/2012 that showed a 5.0 x 5.0 cm region of T2 hyperintensity. He subsequently came to [place] to be closer to his brother and he was seen at [facility] by neuro-oncology. He underwent brain lab image guidance MRI on 811 that showed recurrence of disease and he underwent image guided right frontal craniotomy on 05/31/2012 by Dr. Hassel Neth. Postop patient had a noncontrast CT of the head performed that showed only resection cavity. Patient's final pathology showed a WHO grade 3 anaplastic astrocytoma. He was seen by Dr. Margaretmary Dys for radiation therapy and Dr. Kathrynn Running has kindly referred the patient to me for oncologic evaluation and management. Postoperatively patient is doing well he is accompanied by his wife and his brother. Patient himself is without any complaints. He denies having any headaches double vision blurring of vision weakness or fatigue. He did have some abdominal pain that has not resolved.     Past Medical History  Diagnosis Date  . Brain tumor   . 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 Pneumonia 04/2012  .  Seizures 09/2010  . Rhabdomyolysis     s/p hospitalization 09/2010  . History of hemodialysis 09/2010    x 3 s/p renal failure  . GERD (gastroesophageal reflux disease)   . Anxiety   . Astrocytoma brain tumor   :  Past Surgical History  Procedure Date  . Brain surgery 10/22/2010    [place]  . Craniotomy 05/30/12    Dr Lavonna Monarch, [facility]  :  Current Outpatient Prescriptions  Medication Sig Dispense Refill  . bacitracin ointment Apply 1 application topically daily.      Marland Kitchen ibuprofen (ADVIL,MOTRIN) 200 MG tablet Take 200 mg by mouth every 6 (six) hours as needed.      . levETIRAcetam (KEPPRA) 500 MG tablet Take 500-1,000 mg by mouth 2 (two) times daily. 2 tabs 2 times daily for 10 days, Start date 06/01/12; If no seizures decrease to 1 tab 2 times daily until follow up appt      . acetaminophen (TYLENOL) 650 MG CR tablet Take 650 mg by mouth every 4 (four) hours as needed. For pain/fever      . dexamethasone (DECADRON) 4 MG tablet Take 1 tablet (4 mg total) by mouth 2 (two) times daily with a meal.  20 tablet  1  . ondansetron (ZOFRAN) 4 MG tablet Take 4 mg by mouth every 8 (eight) hours as needed.      Marland Kitchen oxyCODONE (OXY IR/ROXICODONE) 5 MG immediate release tablet Take 5 mg by mouth every 4 (four) hours as needed. For pain      . pantoprazole (PROTONIX) 40 MG tablet Take 40 mg  by mouth daily.      Marland Kitchen senna-docusate (SENOKOT-S) 8.6-50 MG per tablet Take 2 tablets by mouth 2 (two) times daily.      Marland Kitchen temozolomide (TEMODAR) 140 MG capsule Take 1 capsule (140 mg total) by mouth daily. May take on an empty stomach or at bedtime to decrease nausea & vomiting.  40 capsule  0  . temozolomide (TEMODAR) 20 MG capsule Take 1 capsule (20 mg total) by mouth daily. May take on an empty stomach or at bedtime to decrease nausea & vomiting.  40 capsule  2     Allergies  Allergen Reactions  . Milk-Related Compounds     intolerance  . Dilantin (Phenytoin Sodium Extended) Rash  :  Family History    Problem Relation Age of Onset  . Diabetes Mother   . Hypertension Mother   . Hyperlipidemia Mother   . Diabetes Father   . Hypertension Father   :  History   Social History  . Marital Status: Married    Spouse Name: N/A    Number of Children: N/A  . Years of Education: N/A   Occupational History  . Not on file.   Social History Main Topics  . Smoking status: Never Smoker   . Smokeless tobacco: Not on file  . Alcohol Use: No  . Drug Use: No  . Sexually Active: Not Currently   Other Topics Concern  . Not on file   Social History Narrative  . No narrative on file  : REVIEW OF SYSTEMS:  Patient denies fatigue, headache, visual changes, confusion, drenching night sweats, palpable lymph node swelling, mucositis, odynophagia, dysphagia, nausea vomiting, jaundice, chest pain, palpitation, shortness of breath, dyspnea on exertion, productive cough, gum bleeding, epistaxis, hematemesis, hemoptysis, abdominal pain, abdominal swelling, early satiety, melena, hematochezia, hematuria, skin rash, spontaneous bleeding, joint swelling, joint pain, heat or cold intolerance, bowel bladder incontinence, back pain, focal motor weakness, paresthesia, depression, suicidal or homocidal ideation, feeling hopelessness.   Exam:  General:  well-nourished in no acute distress.  Eyes:  no scleral icterus.  ENT:  There were no oropharyngeal lesions.  Neck was without thyromegaly.  Lymphatics:  Negative cervical, supraclavicular or axillary adenopathy.  Respiratory: lungs were clear bilaterally without wheezing or crackles.  Cardiovascular:  Regular rate and rhythm, S1/S2, without murmur, rub or gallop.  There was no pedal edema.  GI:  abdomen was soft, flat, nontender, nondistended, without organomegaly.  Muscoloskeletal:  no spinal tenderness of palpation of vertebral spine.  Skin exam was without echymosis, petichae.  Neuro exam was nonfocal.  Patient was able to get on and off exam table without  assistance.  Gait was normal.  Patient was alerted and oriented.  Attention was good.   Language was appropriate.  Mood was normal without depression.  Speech was not pressured.  Thought content was not tangential.     Lab Results  Component Value Date   WBC 7.7 07/06/2012   HGB 13.4 07/06/2012   HCT 39.9 07/06/2012   PLT 270 07/06/2012   GLUCOSE 98 07/06/2012   ALT 19 07/06/2012   AST 13 07/06/2012   NA 141 07/06/2012   K 3.9 07/06/2012   CL 105 07/06/2012   CREATININE 1.0 07/06/2012   BUN 11.0 07/06/2012   CO2 27 07/06/2012    Mr Brain W Wo Contrast  07/01/2012  *RADIOLOGY REPORT*  Clinical Data: Anaplastic astrocytoma.  Status post resection 05/31/2012 [facility].  MRI HEAD WITHOUT AND WITH CONTRAST  Technique:  Multiplanar, multiecho pulse sequences of the brain and surrounding structures were obtained according to standard protocol without and with intravenous contrast  Contrast: 18mL MULTIHANCE GADOBENATE DIMEGLUMINE 529 MG/ML IV SOLN  Comparison: Preoperative imaging was performed at the can receive. These images are not currently available.  Findings: The patient is status post craniotomy for partial resection of the anterior right frontal lobe.  This includes portions of the anterior genu of the corpus callosum on the right. There is irregular enhancement along the posterior surgical margin where there is focal thickening which measures up to 9 mm, best visualized on the sagittal sequence.  Mild dural enhancement is evident along the other margins without significant soft tissue elsewhere.  No distal enhancement is evident to suggest metastatic disease. Minimal surrounding vasogenic edema is present.  There is some T2 signal extending into the anterior genu of the corpus callosum across midline.  There is no significant mass effect.  Flow is present in the major intracranial arteries.  The brain is otherwise unremarkable.  The ventricles are of normal size.  No significant extra-axial fluid  collection is present.  The globes and orbits are intact.  The paranasal sinuses and mastoid air cells are clear.  IMPRESSION:  1.  Craniotomy with partial resection of the anterior right frontal lobe. 2.  Mild irregular enhancement along the posterior margin of the surgical cavity with areas of focal thickening measuring up to 9 mm.  This will serve as a baseline for further evaluation of tumor recurrence.  Residual tumor along the margin is not excluded. 3.  Minimal surrounding vasogenic edema without significant mass effect. 4.  There is signal abnormality extending across midline in the genu of the corpus callosum.  This could be postsurgical.  Tumor extension is not excluded.   Original Report Authenticated By: Jamesetta Orleans. MATTERN, M.D.       [name] ZO Contrast  07/01/2012  *RADIOLOGY REPORT*  Clinical Data: Anaplastic astrocytoma.  Status post resection 05/31/2012 at [facility].  MRI HEAD WITHOUT AND WITH CONTRAST  Technique:  Multiplanar, multiecho pulse sequences of the brain and surrounding structures were obtained according to standard protocol without and with intravenous contrast  Contrast: 18mL MULTIHANCE GADOBENATE DIMEGLUMINE 529 MG/ML IV SOLN  Comparison: Preoperative imaging was performed at the can receive. These images are not currently available.  Findings: The patient is status post craniotomy for partial resection of the anterior right frontal lobe.  This includes portions of the anterior genu of the corpus callosum on the right. There is irregular enhancement along the posterior surgical margin where there is focal thickening which measures up to 9 mm, best visualized on the sagittal sequence.  Mild dural enhancement is evident along the other margins without significant soft tissue elsewhere.  No distal enhancement is evident to suggest metastatic disease. Minimal surrounding vasogenic edema is present.  There is some T2 signal extending into the anterior genu of the corpus  callosum across midline.  There is no significant mass effect.  Flow is present in the major intracranial arteries.  The brain is otherwise unremarkable.  The ventricles are of normal size.  No significant extra-axial fluid collection is present.  The globes and orbits are intact.  The paranasal sinuses and mastoid air cells are clear.  IMPRESSION:  1.  Craniotomy with partial resection of the anterior right frontal lobe. 2.  Mild irregular enhancement along the posterior margin of the surgical cavity with areas of focal thickening measuring up to 9 mm.  This will serve as a baseline for further evaluation of tumor recurrence.  Residual tumor along the margin is not excluded. 3.  Minimal surrounding vasogenic edema without significant mass effect. 4.  There is signal abnormality extending across midline in the genu of the corpus callosum.  This could be postsurgical.  Tumor extension is not excluded.   Original Report Authenticated By: Jamesetta Orleans. MATTERN, M.D.     Assessment and Plan: 31 year old gentleman with WHO grade 3 anaplastic astrocytoma who is status post resection on 05/31/2012. Patient has been seen by Dr. Margaretmary Dys for postoperative radiation treatment. He has recommended that patient receive concurrent Temodar. This was also recommended by [facility]. The patient and I and his family discussed extensively the role of Temodar concurrently with radiation therapy. Risks and benefits of this were discussed with them. They are very much in favor of starting the radiation as well as the Temodar as soon as possible. Prescriptions were sent to his pharmacy.  Patient's wife is quite concerned about patient's overall well being she is also concerned about fertility issues since she has only been married for about one year in very much interested in having families. We discussed this extensively. I did recommend to them that they may want to consider sperm banking. Also I did recommend that they consider  alternative modes of fertility. Certainly the literature does show that patients have successfully had children after treatment with Temodar however it is recommended that the weight for at least a year after completion of all therapy prior to trying to have children.  I will plan on continuing to see them every week once they are in the midst of the radiation. They know to call me with any problems questions or concerns  Drue Second, MD Medical/Oncology [facility] [phone] (beeper) [phone] (Office)

## 2012-07-15 ENCOUNTER — Encounter: Payer: PRIVATE HEALTH INSURANCE | Admitting: Nutrition

## 2012-07-15 ENCOUNTER — Encounter: Payer: Self-pay | Admitting: Nutrition

## 2012-07-15 ENCOUNTER — Ambulatory Visit
Admission: RE | Admit: 2012-07-15 | Discharge: 2012-07-15 | Disposition: A | Discharge status: Home / Self Care | Payer: PRIVATE HEALTH INSURANCE | Source: Ambulatory Visit | Attending: Radiation Oncology | Admitting: Radiation Oncology

## 2012-07-15 NOTE — Progress Notes (Signed)
Patient did not show up for nutrition appointment. 

## 2012-07-16 ENCOUNTER — Ambulatory Visit
Admission: RE | Admit: 2012-07-16 | Discharge: 2012-07-16 | Disposition: A | Discharge status: Home / Self Care | Payer: PRIVATE HEALTH INSURANCE | Source: Ambulatory Visit | Attending: Radiation Oncology | Admitting: Radiation Oncology

## 2012-07-16 ENCOUNTER — Encounter: Payer: Self-pay | Admitting: Radiation Oncology

## 2012-07-16 VITALS — BP 112/76 | HR 66 | Resp 18 | Wt 195.4 lb

## 2012-07-16 DIAGNOSIS — C719 Malignant neoplasm of brain, unspecified: Secondary | ICD-10-CM

## 2012-07-16 MED ORDER — OXYCODONE HCL 5 MG PO TABS: 5.0000 mg | ORAL_TABLET | ORAL | Status: DC | PRN

## 2012-07-16 NOTE — Progress Notes (Signed)
Patient presents to the clinic today accompanied by his wife for PUT with Dr. Kathrynn Running. Patient is alert and oriented to person, place, and time. No distress noted. Steady gait noted. Pleasant affected noted. Patient denies pain at this time. PATIENT REPORTS TAKING DECADRON 2 MG ONCE PER DAY. Patient reports that he continues to take Temodar as directed. Patient reports taking zofran 30 minutes prior to temodar. Patient denies nausea or vomiting. Patient denies headaches, dizziness, diplopia or floaters. Patient reports left leg pain during th night. Patient reports decreased appetite. Wife goes on to report that for the last two days he has eaten very little. Patient reports that he has slept an average of 6-7 hours the last two night but, the two nights prior no sleep. Reported all findings to Dr. Kathrynn Running.

## 2012-07-17 ENCOUNTER — Encounter: Payer: Self-pay | Admitting: Radiation Oncology

## 2012-07-17 NOTE — Progress Notes (Signed)
  Radiation Oncology         [phone] ________________________________  Name: Brian Hanson MRN: [id number]  Date: 07/16/2012  DOB: 10-21-1980  Weekly Radiation Therapy Management  Current Dose: 7.2 Gy     Planned Dose:  59.4 Gy  Narrative . . . . . . . . The patient presents for routine under treatment assessment.                                                    The patient is without complaint.                                 Set-up films were reviewed.                                 The chart was checked. Physical Findings. . . Weight essentially stable.  No significant changes. Impression . . . . . . . The patient is  tolerating radiation. Plan . . . . . . . . . . . . Continue treatment as planned.  ________________________________  Artist Pais. Kathrynn Running, M.D.

## 2012-07-17 NOTE — Patient Instructions (Signed)
Department of Radiation Oncology  Phone:  [phone] Fax:        [phone]  Radiation Therapy Side Effects Side effects are problems that can happen as a result of treatment. They may happen with radiation therapy because the high doses of radiation used to kill cancer cells can also damage healthy cells in the treatment area. Side effects are different for each person. Some people have many side effects; others have hardly any. Side effects may be more severe if you also receive chemotherapy before, during, or after your radiation therapy. Talk to your radiation therapy team about your chances of having side effects. The team will watch you closely and ask if you notice any problems. If you do have side effects or other problems, your doctor or nurse will talk with you about ways to manage them. Common Side Effects  Many people who get radiation therapy have skin changes and some fatigue. Other side effects depend on the part of your body being treated. Skin changes may include dryness, itching, peeling, or blistering. These changes occur because radiation therapy damages healthy skin cells in the treatment area. You will need to take special care of your skin during radiation therapy. To learn more, see "Skin Changes". Fatigue is often described as feeling worn out or exhausted. There are many ways to manage fatigue. To learn more, see "Fatigue". Depending on the part of your body being treated, you may also have:  Diarrhea  Hair loss in the treatment area  Mouth problems  Nausea and vomiting  Sexual changes  Swelling  Trouble swallowing  Urinary and bladder changes Most of these side effects go away within 2 months after radiation therapy is finished. Late side effects may first occur 6 or more months after radiation therapy is over. They vary by the part of your body that was treated and the dose of radiation you received. Late side effects may include infertility, joint  problems, lymphedema, mouth problems, and secondary cancer. Everyone is different, so talk to your doctor or nurse about whether you might have late side effects and what signs to look for. See "Late Radiation Therapy Side Effects" for more information on late side effects.  "Radiation Therapy Side Effects and Ways to Manage Them" explains each side effect in more detail and includes ways you and your doctor or nurse can help manage them.  Radiation Therapy Side Effects At[employer] Radiation therapy side effects depend on the part of your body being treated. You can use the chart below to see which side effects might affect you. Find the part of your body being treated in the column on the left, then read across the row to see the side effects. A checkmark means that you may get this side effect. Ask your doctor or nurse about your chances of getting each side effect. Talk to your radiation therapy team about your chances of getting side effects. Show them the chart below.  Find the part of your body being treated in the top row.  Read down the column.  A checkmark means you may get the side effect listed.  Radiation Therapy Side Effects and Ways to Manage Them Diarrhea Fatigue Hair Loss Mouth Changes Nausea and Vomiting Sexual and Fertility Changes Skin Changes Throat Changes Urinary and Bladder Changes  Diarrhea  What it is Diarrhea is frequent bowel movements which may be soft, formed, loose, or watery. Diarrhea can occur at any time during radiation therapy. Why it occurs Radiation therapy to  the pelvis, stomach, and abdomen can cause diarrhea. People get diarrhea because radiation harms the healthy cells in the large and small bowels. These areas are very sensitive to the amount of radiation needed to treat cancer. Ways to manage When you have diarrhea: Drink 8 to 12 cups of clear liquid per day. See "Clear Liquids" for ideas of drinks and foods that are clear liquids.  If you  drink liquids that are high in sugar (such as fruit juice, sweet iced tea, Kool-Aid, or Hi-C) ask your nurse or dietitian if you should mix them with water. Eat many small meals and snacks. For instance, eat 5 or 6 small meals and snacks rather than 3 large meals.  Eat foods that are easy on the stomach (which means foods that are low in fiber, fat, and lactose). See "Foods and Drinks That Are Easy on the Stomach" for other ideas of foods that are easy on the stomach. If your diarrhea is severe, your doctor or nurse may suggest the BRAT diet, which stands for bananas, rice, applesauce, and toast.   Take care of your rectal area. Instead of toilet paper, use a baby wipe or squirt of water from a spray bottle to clean yourself after bowel movements. Also, ask your nurse about taking sitz baths, which is a warm-water bath taken in a sitting position that covers only the hips and buttocks. Be sure to tell your doctor or nurse if your rectal area gets sore.  Stay away from:  Milk and dairy foods, such as ice cream, sour cream, and cheese  Spicy foods, such as hot sauce, salsa, chili, and curry dishes  Foods or drinks with caffeine, such as regular coffee, black tea, soda, and chocolate  Foods or drinks that cause gas, such as cooked dried beans, cabbage, broccoli, soy milk, and other soy products  Foods that are high in fiber, such as raw fruits and vegetables, cooked dried beans, and whole wheat breads and cereals  Fried or greasy foods  Food from [employer] Talk to your doctor or nurse. Tell them if you are having diarrhea. He or she will suggest ways to manage it. He or she may also suggest taking medicine, such as Imodium.  To learn more about dealing with diarrhea during cancer treatment, see Eating Hints, a book from the [employer]. You can get a free copy at https://pubs.[employer].se or 1-800-4-CANCER. Fatigue Fatigue is a common side effect, and there is a good  chance that you will feel some level of fatigue from radiation therapy.  What it is Fatigue from radiation therapy can range from a mild to an extreme feeling of being tired. Many people describe fatigue as feeling weak, weary, worn out, heavy, or slow. Why it occurs Fatigue can happen for many reasons. These include: Anemia  Anxiety  Depression  Infection  Lack of activity  Medicines Fatigue can also come from the effort of going to radiation therapy each day or from stress. Most of the time, you will not know why you feel fatigue. How long it lasts When you first feel fatigue depends on a few factors, which include your age, health, level of activity, and how you felt before radiation therapy started. Fatigue can last from 6 weeks to 12 months after your last radiation therapy session. Some people may always feel fatigue and, even after radiation therapy is over, will not have as much energy as they did before. Ways to manage Try to sleep at  least 8 hours each night. This may be more sleep than you needed before radiation therapy. One way to sleep better at night is to be active during the day. For example, you could go for walks, do yoga, or ride a bike. Another way to sleep better at night is to relax before going to bed. You might read a book, work on a jigsaw puzzle, listen to music, or do other calming hobbies.  Plan time to rest. You may need to nap during the day. Many people say that it helps to rest for just 10 to 15 minutes. If you do nap, try to sleep for less than 1 hour at a time.  Try not to do too much. With fatigue, you may not have enough energy to do all the things you want to do. Stay active, but choose the activities that are most important to you. For example, you might go to work but not do housework, or watch your children's sports events but not go out to dinner.  Exercise. Most people feel better when they get some exercise each day. Go for a 15- to 30-minute walk or do  stretches or yoga. Talk with your doctor or nurse about how much exercise you can do while having radiation therapy.  Plan a work schedule that is right for you. Fatigue may affect the amount of energy you have for your job. You may feel well enough to work your full schedule, or you may need to work less - maybe just a few hours a day or a few days each week. You may want to talk with your boss about ways to work from home so you do not have to commute. And you may want to think about going on medical leave while you have radiation therapy.  Plan a radiation therapy schedule that makes sense for you. You may want to schedule your radiation therapy around your work or family schedule. For example, you might want to have radiation therapy in the morning so you can go to work in the afternoon.  Let others help you at home. Check with your insurance company to see whether it covers home care services. You can also ask family members and friends to help when you feel fatigue. Home care staff, family members, and friends can assist with household chores, running errands, or driving you to and from radiation therapy visits. They might also help by cooking meals for you to eat now or freeze for later.  Learn from others who have cancer. People who have cancer can help each other by sharing ways to manage fatigue. One way to meet other people with cancer is by joining a support group - either in person or online. Talk with your doctor or nurse to learn more about support groups.  Talk with your doctor or nurse. If you have trouble dealing with fatigue, your doctor may prescribe medicine (called psychostimulants) that can help decrease fatigue, give you a sense of well-being, and increase your appetite. Your doctor may also suggest treatments if you have anemia, depression, or are not able to sleep at night. Hair Loss What it is Hair loss (also called alopecia) is when some or all of your hair falls out. Why it  occurs Radiation therapy can cause hair loss because it damages cells that grow quickly, such as those in your hair roots. Hair loss from radiation therapy only happens on the part of your body being treated. This is not the same  as hair loss from chemotherapy, which happens all over your body. For instance, you may lose some or all of the hair on your head when you get radiation to your brain. But if you get radiation to your hip, you may lose pubic hair (between your legs) but not the hair on your head. How long it lasts You may start losing hair in your treatment area 2 to 3 weeks after your first radiation therapy session. It takes about a week for all the hair in your treatment area to fall out. Your hair may grow back 3 to 6 months after treatment is over. Sometimes, though, the dose of radiation is so high that your hair never grows back. Once your hair starts to grow back, it may not look or feel the way it did before. Your hair may be thinner, or curly instead of straight. Or it may be darker or lighter in color than it was before. Ways to manage hair loss on your head Before hair loss: Decide whether to cut your hair or shave your head. You may feel more in control of hair loss when you plan ahead. Use an electric razor to prevent nicking yourself if you decide to shave your head.  If you plan to buy a wig, do so while you still have hair. The best time to select your wig is before radiation therapy begins or soon after it starts. This way, the wig will match the color and style of your own hair. Some people take their wig to their hair stylist. You will want to have your wig fitted once you have lost your hair. Make sure to choose a wig that feels comfortable and does not hurt your scalp.  Check with your health insurance company to see whether it will pay for your wig. If it does not, you can deduct the cost of your wig as a medical expense on your income taxes. Some groups also sponsor free  wig banks. Ask your doctor, nurse, or social worker if he or she can refer you to a free wig bank in your area.  Be gentle when you wash your hair. Use a mild shampoo, such as a baby shampoo. Dry your hair by patting (not rubbing) it with a soft towel.  Do not use curling irons, electric hair dryers, curlers, hair bands, clips, or hair sprays. These can hurt your scalp or cause early hair loss.  Do not use products that are harsh on your hair. These include hair colors, perms, gels, mousse, oil, grease, or pomade. After hair loss: Protect your scalp. Your scalp may feel tender after hair loss. Cover your head with a hat, turban, or scarf when you are outside. Try not to be in places where the temperature is very cold or very hot. This means staying away from the direct sun, sun lamps, and very cold air.  Stay warm. Your hair helps keep you warm, so you may feel colder once you lose it. You can stay warmer by wearing a hat, turban, scarf, or wig. You will lose hair only on the part of your body being treated.  Mouth Changes  What they are Radiation therapy to the head or neck can cause problems such as: Mouth sores (little cuts or ulcers in your mouth)  Dry mouth (also called xerostomia) and throat  Loss of taste  Tooth decay  Changes in taste (such as a metallic taste when you eat meat)  Infections of your gums, teeth,  or tongue  Jaw stiffness and bone changes  Thick, rope-like saliva Why they occur Radiation therapy kills cancer cells and can also damage healthy cells such as those in the glands that make saliva and the soft, moist lining of your mouth. How long they last Some problems, like mouth sores, may go away after treatment ends. Others, such as taste changes, may last for months or even years. Some problems, like dry mouth, may never go away.  Visit a dentist at least 2 weeks before starting radiation therapy to your head or neck.  Ways to manage If you are getting radiation  therapy to your head or neck, visit a dentist at least 2 weeks before treatment starts. At this time, your dentist will examine your teeth and mouth and do any needed dental work to make sure your mouth is as healthy as possible before radiation therapy. If you cannot get to the dentist before treatment starts, ask your doctor if you should schedule a visit soon after treatment begins.  Check your mouth every day. This way, you can see or feel problems as soon as they start. Problems can include mouth sores, white patches, or infection.  Keep your mouth moist. You can do this by:  Sipping water often during the day  Sucking on ice chips  Chewing sugar-free gum or sucking on sugar-free hard candy  Using a saliva substitute to help moisten your mouth  Asking your doctor to prescribe medicine that helps increase saliva Clean your mouth, teeth, gums, and tongue.  Brush your teeth, gums, and tongue after every meal and at bedtime.  Use an extra-soft toothbrush. You can make the bristles softer by running warm water over them just before you brush.  Use a fluoride toothpaste.  Use a special fluoride gel that your dentist can prescribe.  Do not use mouthwashes that contain alcohol.  Gently floss your teeth every day. If your gums bleed or hurt, avoid those areas but floss your other teeth.  Rinse your mouth every 1 to 2 hours with a solution of 1/4 teaspoon baking soda and 1/8 teaspoon salt mixed in 1 cup of warm water.  If you have dentures, make sure they fit well and limit how long you wear them each day. If you lose weight, your dentist may need to adjust them.  Keep your dentures clean by soaking or brushing them each day. Be careful what you eat when your mouth is sore.  Choose foods that are easy to chew and swallow.  Take small bites, chew slowly, and sip liquids with your meals.  Eat moist, soft foods such as cooked cereals, mashed potatoes, and scrambled eggs.  Wet and soften food with  gravy, sauce, broth, yogurt, or other liquids.  Eat foods that are warm or at room temperature. Stay away from things that can hurt, scrape, or burn your mouth, such as:  Sharp, crunchy foods such as potato or corn chips  Hot foods  Spicy foods such as hot sauce, curry dishes, salsa, and chili  Fruits and juices that are high in acid such as tomatoes, oranges, lemons, and grapefruits  Toothpicks or other sharp objects  All tobacco products, including cigarettes, pipes, cigars, and chewing tobacco  Drinks that contain alcohol Stay away from foods and drinks that are high in sugar. Foods and drinks that have a lot sugar (such as regular soda, gum, and candy) can cause tooth decay.  Exercise your jaw muscles. Open and close your mouth 20  times as far as you can without causing pain. Do this exercise 3 times a day, even if your jaw isn't stiff.  Medicine. Ask your doctor or nurse about medicines that can protect your saliva glands and the moist tissues that line your mouth.  Call your doctor or nurse when your mouth hurts. There are medicines and other products, such as mouth gels, that can help control mouth pain.  You will need to take extra good care of your mouth for the rest of your life. Ask your dentist how often you will need dental check-ups and how best to take care of your teeth and mouth after radiation therapy is over. Do not use tobacco or drink alcohol while you are getting radiation therapy to your head or neck.  Nausea and Vomiting  What they are Radiation therapy can cause nausea, vomiting, or both. Nausea is when you feel sick to your stomach and feel like you are going to throw up. Vomiting is when you throw up food and fluids. You may also have dry heaves, which happen when your body tries to vomit even though your stomach is empty. Why they occur Nausea and vomiting can occur after radiation therapy to the stomach, small intestine, colon, or parts of the brain. Your risk for  nausea and vomiting depends on how much radiation you are getting, how much of your body is in the treatment area, and whether you are also having chemotherapy. How long they last Nausea and vomiting may occur 30 minutes to many hours after your radiation therapy session ends. You are likely to feel better on days that you do not have radiation therapy. Ways to manage Prevent nausea. The best way to keep from vomiting is to prevent nausea. One way to do this is by having bland, easy-to-digest foods and drinks that do not upset your stomach. These include toast, gelatin, and apple juice. To learn more, see the list of Foods and Drinks That Are Easy on the Stomach.  Try to relax before treatment. You may feel less nausea if you relax before each radiation therapy treatment. You can do this by spending time doing activities you enjoy, such as reading a book, listening to music, or other hobbies.  Plan when to eat and drink. Some people feel better when they eat before radiation therapy; others do not. Learn the best time for you to eat and drink. For example, you might want a snack of crackers and apple juice 1 to 2 hours before radiation therapy. Or, you might feel better if you have treatment on an empty stomach, which means not eating 2 to 3 hours before treatment.  Eat small meals and snacks. Instead of eating 3 large meals each day, you may want to eat 5 or 6 small meals and snacks. Make sure to eat slowly and do not rush.  Have foods and drinks that are warm or cool (not hot or cold). Before eating or drinking, let hot food and drinks cool down and cold food and drinks warm up.  Talk with your doctor or nurse. He or she may suggest a special diet of foods to eat or prescribe medicine to help prevent nausea, which you can take 1 hour before each radiation therapy session. You might also ask your doctor or nurse about acupuncture, which may help relieve nausea and vomiting caused by cancer treatment. Eat  5 or 6 small meals and snacks each day instead of 3 large meals.  Learn more  from Eating Hints, a book from the [employer]. To get a free copy, contact the [employer] Information Service.  Sexual and Fertility Changes What they are Radiation therapy sometimes causes sexual changes, which can include hormone changes and loss of interest in or ability to have sex. It can also affect fertility during and after radiation therapy. For a woman, this means that she might not be able to get pregnant and have a baby. For a man, this means that he might not be able to get a woman pregnant. Sexual and fertility changes differ for men and women. Be sure to tell your doctor if you are pregnant before you start radiation therapy.  Problems for women include: Pain or discomfort when having sex  Vaginal itching, burning, dryness, or atrophy (when the muscles in the vagina become weak and the walls of the vagina become thin)  Vaginal stenosis, when the vagina becomes less elastic, narrows, and gets shorter  Symptoms of menopause for women not yet in menopause. These include hot flashes, vaginal dryness, and not having your period.  Not being able to get pregnant after radiation therapy is over Problems for men include: Impotence (also called erectile dysfunction or ED), which means not being able to have or keep an erection  Not being able to get a woman pregnant after radiation therapy is over due to fewer or less effective sperm  Why they occur Sexual and fertility changes can happen when people get radiation therapy to the pelvic area. For women, this includes radiation to the vagina, uterus, or ovaries. For men, this includes radiation to the testicles or prostate. Many sexual side effects are caused by scar tissue from radiation therapy. Other problems, such as fatigue, pain, anxiety, or depression, can affect your interest in having sex. How long they last After radiation therapy is over, most  people want to have sex as much as they did before treatment. Many sexual side effects go away after treatment ends. But you may have problems with hormone changes and fertility for the rest of your life. If you are able to get pregnant or father a child after you have finished radiation therapy, it should not affect the health of the baby. Ways to manage For both men and women, it is important to be open and honest with your spouse or partner about your feelings, concerns, and how you prefer to be intimate while you are getting radiation therapy. For women, here are some issues to discuss with your doctor or nurse: Fertility. Before radiation therapy starts, let your doctor or nurse know if you think you might want to get pregnant after your treatment ends. He or she can talk with you about ways to preserve your fertility, such as preserving your eggs to use in the future.  Sexual problems. You may or may not have sexual problems. Your doctor or nurse can tell you about side effects you can expect and suggest ways for coping with them.  Birth control. It is very important that you do not get pregnant while having radiation therapy. Radiation therapy can hurt the fetus at all stages of pregnancy. If you have not yet gone through menopause, talk with your doctor or nurse about birth control and ways to keep from getting pregnant.  Pregnancy. Make sure to tell your doctor or nurse if you are already pregnant.  Stretching your vagina. Vaginal stenosis is a common problem for women who have radiation therapy to the pelvis. This can make  it painful to have sex. You can help by stretching your vagina using a dilator (a device that gently stretches the tissues of the vagina). Ask your doctor or nurse where to find a dilator and how to use it.  Lubrication. Use a special lotion for your vagina (such as Replens) once a day to keep it moist. When you have sex, use a water- or mineral oil-based lubricant (such as K-Y  Jelly or Astroglide).  Sex. Ask your doctor or nurse whether it is okay for you to have sex during radiation therapy. Most women can have sex, but it is a good idea to ask and be sure. If sex is painful due to vaginal dryness, you can use a water- or mineral oil-based lubricant.  Talk to your doctor or nurse if you want to have children in the future.  For men, here are some issues to discuss with your doctor or nurse: Fertility. Before you start radiation therapy, let your doctor or nurse know if you think you might want to father children in the future. He or she may talk with you about ways to preserve your fertility before treatment starts, such as banking your sperm. Your sperm will need to be collected before you begin radiation therapy.  Impotence. Your doctor or nurse can let you know whether you are likely to become impotent and how long it might last. Your doctor can prescribe medicine or other treatments that may help.  Sex. Ask if it is okay for you to have sex during radiation therapy. Most men can have sex, but it is a good idea to ask and be sure.  If you want to father children in the future, your sperm will need to be collected before you begin treatment.  Skin Changes What they are Radiation therapy can cause skin changes in your treatment area. Here are some common skin changes: Redness. Your skin in the treatment area may look as if you have a mild to severe sunburn or tan. This can occur on any part of your body where you are getting radiation.  Pruritus. The skin in your treatment area may itch so much that you always feel like scratching. This causes problems because scratching too much can lead to skin breakdown and infection.  Dry and peeling skin. This is when the skin in your treatment area gets very dry - much drier than normal. In fact, your skin may be so dry that it peels like it does after a sunburn.  Moist reaction. Radiation kills skin cells in your treatment area,  causing your skin to peel off faster than it can grow back. When this happens, you can get sores or ulcers. The skin in your treatment area can also become wet, sore, or infected. This is more common where you have skin folds, such as your buttocks, behind your ears, under your breasts. It may also occur where your skin is very thin, such as your neck.  Swollen skin. The skin in your treatment area may be swollen and puffy. Why they occur Radiation therapy causes skin cells to break down and die. When people get radiation almost every day, their skin cells do not have enough time to grow back between treatments. Skin changes can happen on any part of the body that gets radiation. How long they last Skin changes may start a few weeks after you begin radiation therapy. Many of these changes often go away a few weeks after treatment is over. But even  after radiation therapy ends, you may still have skin changes. Your treated skin may always look darker and blotchy. It may feel very dry or thicker than before. And you may always burn quickly and be sensitive to the sun. You will always be at risk for skin cancer in the treatment area. Be sure to avoid tanning beds and protect yourself from the sun by wearing a hat, long sleeves, long pants, and sunscreen with an SPF of 30 or higher. Ways to manage Skin care. Take extra good care of your skin during radiation therapy. Be gentle and do not rub, scrub, or scratch in the treatment area. Also, use creams that your doctor prescribes. Take extra good care of your skin during radiation therapy. Be gentle and do not rub, scrub, or scratch.  Do not put anything on your skin that is very hot or cold. This means not using heating pads, ice packs, or other hot or cold items on the treatment area. It also means washing with lukewarm water.  Be gentle when you shower or take a bath. You can take a lukewarm shower every day. If you prefer to take a lukewarm bath, do so only  every other day and soak for less than 30 minutes. Whether you take a shower or bath, make sure to use a mild soap that does not have fragrance or deodorant in it. Dry yourself with a soft towel by patting, not rubbing, your skin. Be careful not to wash off the ink markings that you need for radiation therapy. Be careful not to wash off the ink markings you need for radiation therapy.  Use only those lotions and skin products that your doctor or nurse suggests. If you are using a prescribed cream for a skin problem or acne, you must tell your doctor or nurse before you begin radiation treatment. Check with your doctor or nurse before using any of the following skin products:  Bubble bath  Cornstarch  Cream  Deodorant  Hair removers  Makeup Oil  Ointment  Perfume  Powder  Soap  Sunscreen     If you use any skin products on days you have radiation therapy, use them at least 4 hours before your treatment session.  Cool, humid places. Your skin may feel much better when you are in cool, humid places. You can make rooms more humid by putting a bowl of water on the radiator or using a humidifier. If you use a humidifier, be sure to follow the directions about cleaning it to prevent bacteria.  Soft fabrics. Wear clothes and use bed sheets that are soft, such as those made from cotton.  Do not wear clothes that are tight and do not breathe, such as girdles and pantyhose.  Protect your skin from the sun every day. The sun can burn you even on cloudy days or when you are outside for just a few minutes. Do not go to the beach or sun bathe. Wear a broad-brimmed hat, long-sleeved shirt, and long pants when you are outside. Talk with your doctor or nurse about sunscreen lotions. He or she may suggest that you use a sunscreen with an SPF of 30 or higher. You will need to protect your skin from the sun even after radiation therapy is over, since you will have an increased risk of skin cancer for the rest of your  life.  Do not use tanning beds. Tanning beds expose you to the same harmful effects as the sun.  Adhesive  tape. Do not put bandages, BAND-AIDS, or other types of sticky tape on your skin in the treatment area. Talk with your doctor or nurse about ways to bandage without tape.  Shaving. Ask your doctor or nurse if you can shave the treated area. If you can shave, use an electric razor and do not use pre-shave lotion.  Rectal area. If you have radiation therapy to the rectal area, you are likely to have skin problems. These problems are often worse after a bowel movement. Clean yourself with a baby wipe or squirt of water from a spray bottle. Also ask your nurse about sitz baths (a warm-water bath taken in a sitting position that covers only the hips and buttocks.)  Talk with your doctor or nurse. Some skin changes can be very serious. Your treatment team will check for skin changes each time you have radiation therapy. Make sure to report any skin changes that you notice.  Medicine. Medicines can help with some skin changes. They include lotions for dry or itchy skin, antibiotics to treat infection, and other drugs to reduce swelling or itching. Throat Changes  What they are Radiation therapy to the neck or chest can cause the lining of your throat to become inflamed and sore. This is called esophagitis. You may feel as if you have a lump in your throat or burning in your chest or throat. You may also have trouble swallowing. Why they occur Radiation therapy to the neck or chest can cause throat changes because it not only kills cancer cells, but can also damage the healthy cells that line your throat. Your risk for throat changes depends on how much radiation you are getting, whether you are also having chemotherapy, and whether you use tobacco and alcohol while you are getting radiation therapy. How long they last You may notice throat changes 2 to 3 weeks after starting radiation. You will most  likely feel better 4 to 6 weeks after radiation therapy has finished. Ways to manage Be careful what you eat when your throat is sore.  Choose foods that are easy to swallow.  Cut, blend, or shred foods to make them easier to eat.  Eat moist, soft foods such as cooked cereals, mashed potatoes, and scrambled eggs.  Wet and soften food with gravy, sauce, broth, yogurt, or other liquids.  Drink cool drinks.  Sip drinks through a straw.  Eat foods that are cool or at room temperature. Eat small meals and snacks. It may be easier to eat a small amount of food at one time. Instead of eating 3 large meals each day, you may want to eat 5 or 6 small meals and snacks.  Choose foods and drinks that are high in calories and protein. When it hurts to swallow, you may eat less and lose weight. It is important to keep your weight the same during radiation therapy. Having foods and drinks that are high in calories and protein can help you. See the chart of foods and drinks that are high in calories and protein for ideas.  Sit upright and bend your head slightly forward when you are eating or drinking. Remain sitting or standing upright for at least 30 minutes after eating.  Don't have things that can burn or scrape your throat, such as:  Hot foods and drinks  Spicy foods  Foods and juices that are high in acid, such as tomatoes and oranges  Sharp, crunchy foods such as potato or corn chips  All tobacco  products, such as cigarettes, pipes, cigars, and chewing tobacco  Drinks that contain alcohol Talk with a dietitian. He or she can help make sure you eat enough to maintain your weight. This may include choosing foods that are high in calories and protein and foods that are easy to swallow.  Talk with your doctor or nurse. Let your doctor or nurse know if you notice throat changes, such as trouble swallowing, feeling as if you are choking, or coughing while eating or drinking. Also, let him or her know if you  have pain or lose any weight. Your doctor can prescribe medicines that may help relieve your symptoms, such as antacids, gels that coat your throat, and pain killers. Let your doctor or nurse know if you:  Have trouble swallowing  Feel as if you are choking  Cough while you are eatingor drinking Urinary and Bladder Changes  What they are Radiation therapy can cause urinary and bladder problems, which can include: Burning or pain when you begin to urinate or after you empty your bladder  Trouble starting to urinate  Trouble emptying your bladder  Frequent, urgent need to urinate  Cystitis, a swelling (inflammation) in your urinary tract  Incontinence, when you cannot control the flow of urine from your bladder, especially when coughing or sneezing  Frequent need to get up during sleep to urinate  Blood in your urine  Bladder spasms, which are like painful muscle cramps Why they occur Urinary and bladder problems may occur when people get radiation therapy to the prostate or bladder. Radiation therapy can harm the healthy cells of the bladder wall and urinary tract, which can cause inflammation, ulcers, and infection. How long they last Urinary and bladder problems often start 3 to 5 weeks after radiation therapy begins. Most problems go away 2 to 8 weeks after treatment is over. Ways to manage Drink a lot of fluids. This means 6 to 8 cups of fluids each day. Drink enough fluids so that your urine is clear to light yellow in color.  Avoid coffee, black tea, alcohol, spices, and all tobacco products.  Talk with your doctor or nurse if you think you have urinary or bladder problems. He or she may ask for a urine sample to make sure that you do not have an infection.   Talk to your doctor or nurse if you have incontinence. He or she may refer you to a physical therapist who will assess your problem. The therapist can give you exercises to improve bladder control.  Medicine. Your doctor may  prescribe antibiotics if your problems are caused by an infection. Other medicines can help you urinate, reduce burning or pain, and ease bladder spasms. Drink 6 to 8 cups of fluids each day.

## 2012-07-19 ENCOUNTER — Ambulatory Visit
Admission: RE | Admit: 2012-07-19 | Discharge: 2012-07-19 | Disposition: A | Discharge status: Home / Self Care | Payer: PRIVATE HEALTH INSURANCE | Source: Ambulatory Visit | Attending: Radiation Oncology | Admitting: Radiation Oncology

## 2012-07-19 ENCOUNTER — Encounter: Payer: Self-pay | Admitting: Radiation Oncology

## 2012-07-19 NOTE — Addendum Note (Signed)
Encounter addended by: Lexington Krotz Mintz Tatyanna Cronk, RN on: 07/19/2012  8:01 PM<BR>     Documentation filed: Charges VN

## 2012-07-20 ENCOUNTER — Ambulatory Visit
Admission: RE | Admit: 2012-07-20 | Discharge: 2012-07-20 | Disposition: A | Discharge status: Home / Self Care | Payer: PRIVATE HEALTH INSURANCE | Source: Ambulatory Visit | Attending: Radiation Oncology | Admitting: Radiation Oncology

## 2012-07-20 ENCOUNTER — Ambulatory Visit: Payer: PRIVATE HEALTH INSURANCE | Admitting: Oncology

## 2012-07-21 ENCOUNTER — Ambulatory Visit
Admission: RE | Admit: 2012-07-21 | Discharge: 2012-07-21 | Disposition: A | Discharge status: Home / Self Care | Payer: PRIVATE HEALTH INSURANCE | Source: Ambulatory Visit | Attending: Radiation Oncology | Admitting: Radiation Oncology

## 2012-07-22 ENCOUNTER — Ambulatory Visit: Payer: PRIVATE HEALTH INSURANCE | Admitting: Nutrition

## 2012-07-22 ENCOUNTER — Ambulatory Visit
Admission: RE | Admit: 2012-07-22 | Discharge: 2012-07-22 | Disposition: A | Discharge status: Home / Self Care | Payer: PRIVATE HEALTH INSURANCE | Source: Ambulatory Visit | Attending: Radiation Oncology | Admitting: Radiation Oncology

## 2012-07-22 NOTE — Assessment & Plan Note (Signed)
This is a 31 year old male patient of Dr. Welton Flakes and Dr. Kathrynn Running diagnosed with brain cancer.  MEDICAL HISTORY INCLUDES:  Rhabdomyolysis, renal failure, dialysis, GERD, anxiety.  MEDICATIONS INCLUDE:  Keppra, Decadron, Zofran, Protonix, Senokot, Temodar.  LABS:  Reviewed.  HEIGHT:  69 inches. WEIGHT:  192.8 pounds. USUAL BODY WEIGHT:  Approximately 190 pounds. BMI:  28.46.  Patient and wife present to nutrition consult.  Patient concerned his diet may not be adequate.  Wife expresses interest in knowing if he needs to modify his current diet to include any more foods or exclude spicy foods that patient currently consumes.  He has no complaints regarding nausea or vomiting, diarrhea or constipation.  He has no stomach distress.  His weight is stable.  NUTRITION DIAGNOSIS:  Food and nutrition related knowledge deficit related to diagnosis of brain cancer and associated treatments as evidenced by no prior need for nutrition-related information.  INTERVENTION:  I educated the patient to continue current diet as tolerated but to be sure to include protein sources of fish and chicken which he currently eats.  He is going to explore options of adding nuts and yogurts to his diet for added protein.  We discussed the importance of fluid intake and I have encouraged him to increase water and provided strategies for increased tolerance.  I provided some oral nutrition supplement samples for the patient to try for added protein. I have also given him a fact sheet on protein foods.  MONITORING/EVALUATION (GOALS):  Patient will tolerate an adequate diet for weight maintenance and adequate protein intake.  NEXT VISIT:  Patient will contact me if he has questions or concerns or if he develops nutritional issues.    ______________________________ Zenovia Jarred, RD, CSO, LDN Clinical Nutrition Specialist BN/MEDQ  D:  07/22/2012  T:  07/22/2012  Job:  [id number]

## 2012-07-23 ENCOUNTER — Other Ambulatory Visit: Payer: Self-pay | Admitting: Emergency Medicine

## 2012-07-23 ENCOUNTER — Ambulatory Visit
Admission: RE | Admit: 2012-07-23 | Discharge: 2012-07-23 | Disposition: A | Discharge status: Home / Self Care | Payer: PRIVATE HEALTH INSURANCE | Source: Ambulatory Visit | Attending: Radiation Oncology | Admitting: Radiation Oncology

## 2012-07-23 ENCOUNTER — Ambulatory Visit (HOSPITAL_BASED_OUTPATIENT_CLINIC_OR_DEPARTMENT_OTHER): Payer: PRIVATE HEALTH INSURANCE

## 2012-07-23 ENCOUNTER — Encounter: Payer: Self-pay | Admitting: Radiation Oncology

## 2012-07-23 VITALS — BP 121/83 | HR 86 | Temp 98.2°F | Resp 20 | Wt 198.4 lb

## 2012-07-23 DIAGNOSIS — C719 Malignant neoplasm of brain, unspecified: Secondary | ICD-10-CM

## 2012-07-23 LAB — COMPREHENSIVE METABOLIC PANEL (CC13)
AST: 11 U/L (ref 5–34)
BUN: 18 mg/dL (ref 7.0–26.0)
Calcium: 9 mg/dL (ref 8.4–10.4)
Chloride: 109 mEq/L — ABNORMAL HIGH (ref 98–107)
Creatinine: 0.9 mg/dL (ref 0.7–1.3)
Total Bilirubin: 0.3 mg/dL (ref 0.20–1.20)

## 2012-07-23 LAB — CBC WITH DIFFERENTIAL/PLATELET
BASO%: 0.1 % (ref 0.0–2.0)
Basophils Absolute: 0 10*3/uL (ref 0.0–0.1)
EOS%: 0.1 % (ref 0.0–7.0)
HCT: 41.8 % (ref 38.4–49.9)
HGB: 13.8 g/dL (ref 13.0–17.1)
LYMPH%: 13.5 % — ABNORMAL LOW (ref 14.0–49.0)
MCH: 27.8 pg (ref 27.2–33.4)
MCHC: 33 g/dL (ref 32.0–36.0)
MCV: 84.1 fL (ref 79.3–98.0)
NEUT%: 80 % — ABNORMAL HIGH (ref 39.0–75.0)
Platelets: 215 10*3/uL (ref 140–400)
lymph#: 1.9 10*3/uL (ref 0.9–3.3)

## 2012-07-23 NOTE — Progress Notes (Signed)
Quick Note:  Please call patient: cbc looks good ______

## 2012-07-23 NOTE — Progress Notes (Signed)
Pt denies headache, pain, loss of appetite, nausea, blurred vision. Wife states pt c/o intermittent pain just above eyebrows at times, but states he has not taken pain meds this week. Pt is fatigued. Pt states "when he has radiation tx he smells something burning". Pt also pointed out a few "red bumps" on his right temple, no itching. Pt applying lotion to this area bid.  Pt states leg pain much improved. Pt takes Zofran prior to taking Temodar and states he needs refill. Decadron 2 mg daily.

## 2012-07-25 ENCOUNTER — Encounter: Payer: Self-pay | Admitting: Radiation Oncology

## 2012-07-25 NOTE — Progress Notes (Signed)
  Radiation Oncology         [phone] ________________________________  Name: Jaben Benegas MRN: [id number]  Date: 07/23/2012  DOB: 08/27/1981  Weekly Radiation Therapy Management  Current Dose: 16.2 Gy     Planned Dose:  59.4 Gy  Narrative . . . . . . . . The patient presents for routine under treatment assessment.                                The patient is without complaint except occasional headache.                                 Set-up films were reviewed.                                 The chart was checked. Physical Findings. . .  weight is 198 lb 6.4 oz (89.994 kg). His oral temperature is 98.2 F (36.8 C). His blood pressure is 121/83 and his pulse is 86. His respiration is 20. . Weight essentially stable.  No significant changes. Las:  CBC   Component Value Date/Time   WBC 14.2* 07/23/2012 1516   WBC 19.9* 06/11/2012 1534  Impression . . . . . . . The patient is  tolerating radiation. Plan . . . . . . . . . . . . Continue treatment as planned.  He does have mild leukocytosis with headache.  He is afebrile at this time.  I advised the patient to monitor temperature and contact us and his surgical team if he develops worsening headache or fever, given his previous skull osteomyelitis.  ________________________________  Artist Pais Kathrynn Running, M.D.

## 2012-07-26 ENCOUNTER — Telehealth: Payer: Self-pay | Admitting: Emergency Medicine

## 2012-07-26 ENCOUNTER — Ambulatory Visit
Admission: RE | Admit: 2012-07-26 | Discharge: 2012-07-26 | Disposition: A | Discharge status: Home / Self Care | Payer: PRIVATE HEALTH INSURANCE | Source: Ambulatory Visit | Attending: Radiation Oncology | Admitting: Radiation Oncology

## 2012-07-27 ENCOUNTER — Ambulatory Visit
Admission: RE | Admit: 2012-07-27 | Discharge: 2012-07-27 | Disposition: A | Discharge status: Home / Self Care | Payer: PRIVATE HEALTH INSURANCE | Source: Ambulatory Visit | Attending: Radiation Oncology | Admitting: Radiation Oncology

## 2012-07-28 ENCOUNTER — Ambulatory Visit
Admission: RE | Admit: 2012-07-28 | Discharge: 2012-07-28 | Disposition: A | Discharge status: Home / Self Care | Payer: PRIVATE HEALTH INSURANCE | Source: Ambulatory Visit | Attending: Radiation Oncology | Admitting: Radiation Oncology

## 2012-07-29 ENCOUNTER — Encounter: Payer: Self-pay | Admitting: Oncology

## 2012-07-29 ENCOUNTER — Ambulatory Visit: Payer: PRIVATE HEALTH INSURANCE

## 2012-07-29 ENCOUNTER — Other Ambulatory Visit (HOSPITAL_BASED_OUTPATIENT_CLINIC_OR_DEPARTMENT_OTHER): Payer: PRIVATE HEALTH INSURANCE | Admitting: Lab

## 2012-07-29 ENCOUNTER — Ambulatory Visit (HOSPITAL_BASED_OUTPATIENT_CLINIC_OR_DEPARTMENT_OTHER): Payer: PRIVATE HEALTH INSURANCE | Admitting: Oncology

## 2012-07-29 VITALS — BP 118/82 | HR 83 | Temp 97.8°F | Resp 16 | Wt 197.1 lb

## 2012-07-29 DIAGNOSIS — C719 Malignant neoplasm of brain, unspecified: Secondary | ICD-10-CM

## 2012-07-29 DIAGNOSIS — C711 Malignant neoplasm of frontal lobe: Secondary | ICD-10-CM

## 2012-07-29 LAB — COMPREHENSIVE METABOLIC PANEL (CC13)
BUN: 18 mg/dL (ref 7.0–26.0)
CO2: 24 mEq/L (ref 22–29)
Calcium: 9 mg/dL (ref 8.4–10.4)
Chloride: 106 mEq/L (ref 98–107)
Creatinine: 1 mg/dL (ref 0.7–1.3)

## 2012-07-29 LAB — CBC WITH DIFFERENTIAL/PLATELET
Basophils Absolute: 0.1 10*3/uL (ref 0.0–0.1)
HCT: 44.1 % (ref 38.4–49.9)
HGB: 14.7 g/dL (ref 13.0–17.1)
LYMPH%: 24.9 % (ref 14.0–49.0)
MCH: 28.6 pg (ref 27.2–33.4)
MONO#: 0.7 10*3/uL (ref 0.1–0.9)
NEUT%: 67.2 % (ref 39.0–75.0)
Platelets: 208 10*3/uL (ref 140–400)
WBC: 10.7 10*3/uL — ABNORMAL HIGH (ref 4.0–10.3)
lymph#: 2.7 10*3/uL (ref 0.9–3.3)

## 2012-07-29 NOTE — Progress Notes (Signed)
OFFICE PROGRESS NOTE  CC Dr. 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 In June 2013 patient had developed headaches and an MRI showed 5.0 x 5.0 cm mass. He was seen at [facility] by Dr. Hassel Neth and underwent a resection. He subsequently was referred to [place] and has seen Dr. Margaretmary Bayley for consideration of radiation.  CURRENT THERAPY: Temodar concurrently with radiation.  INTERVAL HISTORY: Brian Hanson 31 y.o. male returns for followup visit. Patient currently is taking Temodar 75 mg per meter squared along with his radiation. His Temodar is daily 7 days a week. He will continue this for the duration. Overall he is tolerating this quite nicely without any significant problems. He denies having any nausea vomiting fevers chills night sweats. He does have some fatigue. He denies having any bleeding problems. He has not had any seizure activity. Remainder of the 10 point review of systems is unremarkable  MEDICAL HISTORY: Past Medical History  Diagnosis Date  . Brain tumor   . 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 Pneumonia 04/2012  . Seizures 09/2010  . Rhabdomyolysis     s/p hospitalization 09/2010  . History of hemodialysis 09/2010    x 3 s/p renal failure  . GERD (gastroesophageal reflux disease)   . Anxiety   . Astrocytoma brain tumor     ALLERGIES:  is allergic to milk-related compounds and dilantin.  MEDICATIONS:  Current Outpatient Prescriptions  Medication Sig Dispense Refill  . dexamethasone (DECADRON) 4 MG tablet Take 1 tablet (4 mg total) by mouth 2 (two) times daily  with a meal.  20 tablet  1  . levETIRAcetam (KEPPRA) 500 MG tablet Take 500-1,000 mg by mouth 2 (two) times daily. 2 tabs 2 times daily for 10 days, Start date 06/01/12; If no seizures decrease to 1 tab 2 times daily until follow up appt      . ondansetron (ZOFRAN) 4 MG tablet Take 4 mg by mouth every 8 (eight) hours as needed.      . pantoprazole (PROTONIX) 40 MG tablet Take 40 mg by mouth daily.      Marland Kitchen temozolomide (TEMODAR) 140 MG capsule Take 1 capsule (140 mg total) by mouth daily. May take on an empty stomach or at bedtime to decrease nausea & vomiting.  40 capsule  0  . temozolomide (TEMODAR) 20 MG capsule Take 1 capsule (20 mg total) by mouth daily. May take on an empty stomach or at bedtime to decrease nausea & vomiting.  40 capsule  2  . oxyCODONE (OXY IR/ROXICODONE) 5 MG immediate release tablet Take 1-2 tablets (5-10 mg total) by mouth every 4 (four) hours as needed for pain. For pain  60 tablet  0    SURGICAL HISTORY:  Past Surgical History  Procedure Date  . Brain surgery 10/22/2010    [place]  . Craniotomy 05/30/12    Dr Lavonna Monarch, [facility]    REVIEW OF SYSTEMS:  Pertinent items are noted in HPI.   HEALTH MAINTENANCE:  PHYSICAL EXAMINATION: Blood pressure 118/82, pulse 83, temperature 97.8 F (36.6 C), temperature source Oral, resp. rate 16, weight 197 lb 2 oz (89.415  kg). There is no height on file to calculate BMI. ECOG PERFORMANCE STATUS: 1 - Symptomatic but completely ambulatory   General appearance: alert, cooperative and appears stated age Resp: clear to auscultation bilaterally Back: symmetric, no curvature. ROM normal. No CVA tenderness. Cardio: regular rate and rhythm GI: soft, non-tender; bowel sounds normal; no masses,  no organomegaly Extremities: extremities normal, atraumatic, no cyanosis or edema Neurologic: Grossly normal   LABORATORY DATA: Lab Results  Component Value Date   WBC 10.7* 07/29/2012   HGB 14.7 07/29/2012   HCT 44.1 07/29/2012   MCV  85.6 07/29/2012   PLT 208 07/29/2012      Chemistry      Component Value Date/Time   NA 140 07/23/2012 1516   NA 135 06/11/2012 1534   K 3.9 07/23/2012 1516   K 4.3 06/11/2012 1534   CL 109* 07/23/2012 1516   CL 96 06/11/2012 1534   CO2 21* 07/23/2012 1516   CO2 29 06/11/2012 1534   BUN 18.0 07/23/2012 1516   BUN 18 06/11/2012 1534   CREATININE 0.9 07/23/2012 1516   CREATININE 0.79 06/11/2012 1534      Component Value Date/Time   CALCIUM 9.0 07/23/2012 1516   CALCIUM 9.1 06/11/2012 1534   ALKPHOS 93 07/23/2012 1516   ALKPHOS 76 06/11/2012 1534   AST 11 07/23/2012 1516   AST 18 06/11/2012 1534   ALT 27 07/23/2012 1516   ALT 38 06/11/2012 1534   BILITOT 0.30 07/23/2012 1516   BILITOT 0.3 06/11/2012 1534       RADIOGRAPHIC STUDIES:  [name] ZO Contrast  07/01/2012  *RADIOLOGY REPORT*  Clinical Data: Anaplastic astrocytoma.  Status post resection 05/31/2012 at [facility].  MRI HEAD WITHOUT AND WITH CONTRAST  Technique:  Multiplanar, multiecho pulse sequences of the brain and surrounding structures were obtained according to standard protocol without and with intravenous contrast  Contrast: 18mL MULTIHANCE GADOBENATE DIMEGLUMINE 529 MG/ML IV SOLN  Comparison: Preoperative imaging was performed at the can receive. These images are not currently available.  Findings: The patient is status post craniotomy for partial resection of the anterior right frontal lobe.  This includes portions of the anterior genu of the corpus callosum on the right. There is irregular enhancement along the posterior surgical margin where there is focal thickening which measures up to 9 mm, best visualized on the sagittal sequence.  Mild dural enhancement is evident along the other margins without significant soft tissue elsewhere.  No distal enhancement is evident to suggest metastatic disease. Minimal surrounding vasogenic edema is present.  There is some T2 signal extending into the anterior genu of the corpus callosum  across midline.  There is no significant mass effect.  Flow is present in the major intracranial arteries.  The brain is otherwise unremarkable.  The ventricles are of normal size.  No significant extra-axial fluid collection is present.  The globes and orbits are intact.  The paranasal sinuses and mastoid air cells are clear.  IMPRESSION:  1.  Craniotomy with partial resection of the anterior right frontal lobe. 2.  Mild irregular enhancement along the posterior margin of the surgical cavity with areas of focal thickening measuring up to 9 mm.  This will serve as a baseline for further evaluation of tumor recurrence.  Residual tumor along the margin is not excluded. 3.  Minimal surrounding vasogenic edema without significant mass effect. 4.  There is signal abnormality extending across midline in the genu of the corpus callosum.  This could be  postsurgical.  Tumor extension is not excluded.   Original Report Authenticated By: Jamesetta Orleans. MATTERN, M.D.     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 Now receiving radiation with concurrent Temodar at 75 mg per meter squared. Overall he is tolerating it well. No side effects are experienced currently.   PLAN:   #1 we will continue Temodar daily.  #2 I will continue to see him on a weekly basis with weekly blood counts.   All questions were answered. The patient knows to call the clinic with any problems, questions or concerns. We can certainly see the patient much sooner if necessary.  I spent 25 minutes counseling the patient face to face. The total time spent in the appointment was 30 minutes.    Drue Second, MD Medical/Oncology [facility] [phone] (beeper) [phone] (Office)  07/29/2012, 3:30 PM

## 2012-07-29 NOTE — Patient Instructions (Addendum)
Continue temodar daily  I will continue to see you weekly

## 2012-07-30 ENCOUNTER — Ambulatory Visit
Admission: RE | Admit: 2012-07-30 | Discharge: 2012-07-30 | Disposition: A | Discharge status: Home / Self Care | Payer: PRIVATE HEALTH INSURANCE | Source: Ambulatory Visit | Attending: Radiation Oncology | Admitting: Radiation Oncology

## 2012-07-30 ENCOUNTER — Telehealth: Payer: Self-pay | Admitting: Oncology

## 2012-07-30 ENCOUNTER — Encounter: Payer: Self-pay | Admitting: Radiation Oncology

## 2012-07-30 VITALS — Resp 18 | Wt 200.4 lb

## 2012-07-30 DIAGNOSIS — C719 Malignant neoplasm of brain, unspecified: Secondary | ICD-10-CM

## 2012-07-30 MED ORDER — BIAFINE EX EMUL
Freq: Once | CUTANEOUS | Status: AC
  Administered 2012-07-30: 18:00:00 via TOPICAL

## 2012-07-30 NOTE — Telephone Encounter (Signed)
S/w the pt's brother and he will let the pt know to stop by the scheduling desk to pick up their appt calendars for oct-dec.

## 2012-07-30 NOTE — Progress Notes (Signed)
Patient presents to the clinic today accompanied by his wife for PUT with Dr. Kathrynn Running. Patient is alert and oriented to person, place, and time. No distress noted. Steady gait noted. Pleasant affected noted. Patient denies pain at this time. PATIENT REPORTS TAKING DECADRON 2 MG ONCE PER DAY. Patient reports that he continues to take Temodar and Keppra as directed. Patient reports taking zofran 30 minutes prior to temodar. Patient denies nausea and vomiting.Patient denies dizziness, diplopia or floaters. Patient reports occasional mild headaches.  Patient reports occasional left leg pain during th night continues. Patient reports his appetite is improving. Patient reports that his slept habits have greatly improved from last week. Patient reports fatigue. Patient is concerned about rash on forehead and hair loss frontal scalp. No thick white coating or sores noted in mouth. Reported all findings to Dr. Kathrynn Running.

## 2012-07-30 NOTE — Progress Notes (Signed)
  Radiation Oncology         [phone] ________________________________  Name: Brian Hanson MRN: [id number]  Date: 07/30/2012  DOB: 22-Sep-1981  Weekly Radiation Therapy Management  Current Dose: 23.4 Gy     Planned Dose:  59.4 Gy  Narrative . . . . . . . . The patient presents for routine under treatment assessment.                                    The patient has some erythema and follicular reaction on forehead.                                 Set-up films were reviewed.                                 The chart was checked. Physical Findings. . .  weight is 200 lb 6.4 oz (90.901 kg). His respiration is 18. . Weight essentially stable.  No significant changes. Impression . . . . . . . The patient is  tolerating radiation. Plan . . . . . . . . . . . . Continue treatment as planned.  ________________________________  Artist Pais. Kathrynn Running, M.D.

## 2012-08-02 ENCOUNTER — Ambulatory Visit
Admission: RE | Admit: 2012-08-02 | Discharge: 2012-08-02 | Disposition: A | Discharge status: Home / Self Care | Payer: PRIVATE HEALTH INSURANCE | Source: Ambulatory Visit | Attending: Radiation Oncology | Admitting: Radiation Oncology

## 2012-08-03 ENCOUNTER — Ambulatory Visit
Admission: RE | Admit: 2012-08-03 | Discharge: 2012-08-03 | Disposition: A | Discharge status: Home / Self Care | Payer: PRIVATE HEALTH INSURANCE | Source: Ambulatory Visit | Attending: Radiation Oncology | Admitting: Radiation Oncology

## 2012-08-04 ENCOUNTER — Ambulatory Visit
Admission: RE | Admit: 2012-08-04 | Discharge: 2012-08-04 | Disposition: A | Discharge status: Home / Self Care | Payer: PRIVATE HEALTH INSURANCE | Source: Ambulatory Visit | Attending: Radiation Oncology | Admitting: Radiation Oncology

## 2012-08-05 ENCOUNTER — Other Ambulatory Visit (HOSPITAL_BASED_OUTPATIENT_CLINIC_OR_DEPARTMENT_OTHER): Payer: PRIVATE HEALTH INSURANCE | Admitting: Lab

## 2012-08-05 ENCOUNTER — Ambulatory Visit
Admission: RE | Admit: 2012-08-05 | Discharge: 2012-08-05 | Disposition: A | Discharge status: Home / Self Care | Payer: PRIVATE HEALTH INSURANCE | Source: Ambulatory Visit | Attending: Radiation Oncology | Admitting: Radiation Oncology

## 2012-08-05 ENCOUNTER — Encounter: Payer: Self-pay | Admitting: Oncology

## 2012-08-05 ENCOUNTER — Ambulatory Visit (HOSPITAL_BASED_OUTPATIENT_CLINIC_OR_DEPARTMENT_OTHER): Payer: PRIVATE HEALTH INSURANCE | Admitting: Oncology

## 2012-08-05 VITALS — BP 121/85 | HR 77 | Temp 98.2°F | Resp 20 | Ht 69.0 in | Wt 201.0 lb

## 2012-08-05 DIAGNOSIS — C719 Malignant neoplasm of brain, unspecified: Secondary | ICD-10-CM

## 2012-08-05 DIAGNOSIS — C711 Malignant neoplasm of frontal lobe: Secondary | ICD-10-CM

## 2012-08-05 LAB — COMPREHENSIVE METABOLIC PANEL (CC13)
Albumin: 3.6 g/dL (ref 3.5–5.0)
BUN: 20 mg/dL (ref 7.0–26.0)
CO2: 22 mEq/L (ref 22–29)
Calcium: 9.2 mg/dL (ref 8.4–10.4)
Chloride: 108 mEq/L — ABNORMAL HIGH (ref 98–107)
Glucose: 112 mg/dl — ABNORMAL HIGH (ref 70–99)
Potassium: 3.6 mEq/L (ref 3.5–5.1)

## 2012-08-05 LAB — CBC WITH DIFFERENTIAL/PLATELET
Basophils Absolute: 0 10*3/uL (ref 0.0–0.1)
Eosinophils Absolute: 0.2 10*3/uL (ref 0.0–0.5)
HCT: 42 % (ref 38.4–49.9)
HGB: 14 g/dL (ref 13.0–17.1)
NEUT#: 4.5 10*3/uL (ref 1.5–6.5)
NEUT%: 61.1 % (ref 39.0–75.0)
RDW: 15.4 % — ABNORMAL HIGH (ref 11.0–14.6)
lymph#: 2.2 10*3/uL (ref 0.9–3.3)

## 2012-08-05 NOTE — Patient Instructions (Addendum)
Continue temodar daily as prescribed.  I will see you back in 1 week

## 2012-08-05 NOTE — Progress Notes (Signed)
OFFICE PROGRESS NOTE  CC Dr. 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 In June 2013 patient had developed headaches and an MRI showed 5.0 x 5.0 cm mass. He was seen at [facility] by Dr. Hassel Neth and underwent a resection. He subsequently was referred to [place] and has seen Dr. Margaretmary Bayley for consideration of radiation.  CURRENT THERAPY: Temodar concurrently with radiation.  INTERVAL HISTORY: Brian Hanson 31 y.o. male returns for followup visit. Patient currently is taking Temodar 75 mg per meter squared along with his radiation. His Temodar is daily 7 days a week. He will continue this for the duration. Overall he is tolerating this quite nicely without any significant problems. He denies having any nausea vomiting fevers chills night sweats. He does have some fatigue. He denies having any bleeding problems. He has not had any seizure activity. Remainder of the 10 point review of systems is unremarkable  MEDICAL HISTORY: Past Medical History  Diagnosis Date  . Brain tumor   . 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 Pneumonia 04/2012  . Seizures 09/2010  . Rhabdomyolysis     s/p hospitalization 09/2010  . History of hemodialysis 09/2010    x 3 s/p renal failure  . GERD (gastroesophageal reflux disease)   . Anxiety   . Astrocytoma brain tumor     ALLERGIES:  is allergic to milk-related compounds and dilantin.  MEDICATIONS:  Current Outpatient Prescriptions  Medication Sig Dispense Refill  . dexamethasone (DECADRON) 4 MG tablet Take 1 tablet (4 mg total) by mouth 2 (two) times daily  with a meal.  20 tablet  1  . levETIRAcetam (KEPPRA) 500 MG tablet Take 500-1,000 mg by mouth 2 (two) times daily. 2 tabs 2 times daily for 10 days, Start date 06/01/12; If no seizures decrease to 1 tab 2 times daily until follow up appt      . ondansetron (ZOFRAN) 4 MG tablet Take 4 mg by mouth every 8 (eight) hours as needed.      Marland Kitchen oxyCODONE (OXY IR/ROXICODONE) 5 MG immediate release tablet Take 1-2 tablets (5-10 mg total) by mouth every 4 (four) hours as needed for pain. For pain  60 tablet  0  . pantoprazole (PROTONIX) 40 MG tablet Take 40 mg by mouth daily.      Marland Kitchen temozolomide (TEMODAR) 140 MG capsule Take 1 capsule (140 mg total) by mouth daily. May take on an empty stomach or at bedtime to decrease nausea & vomiting.  40 capsule  0  . temozolomide (TEMODAR) 20 MG capsule Take 1 capsule (20 mg total) by mouth daily. May take on an empty stomach or at bedtime to decrease nausea & vomiting.  40 capsule  2    SURGICAL HISTORY:  Past Surgical History  Procedure Date  . Brain surgery 10/22/2010    [place]  . Craniotomy 05/30/12    Dr Lavonna Monarch, [facility]    REVIEW OF SYSTEMS:  Pertinent items are noted in HPI.   HEALTH MAINTENANCE:  PHYSICAL EXAMINATION: Blood pressure 121/85, pulse 77, temperature 98.2 F (36.8 C), temperature source Oral, resp. rate 20, height 5\' 9"  (1.753 m), weight  201 lb (91.173 kg). Body mass index is 29.68 kg/(m^2). ECOG PERFORMANCE STATUS: 1 - Symptomatic but completely ambulatory   General appearance: alert, cooperative and appears stated age Resp: clear to auscultation bilaterally Back: symmetric, no curvature. ROM normal. No CVA tenderness. Cardio: regular rate and rhythm GI: soft, non-tender; bowel sounds normal; no masses,  no organomegaly Extremities: extremities normal, atraumatic, no cyanosis or edema Neurologic: Grossly normal   LABORATORY DATA: Lab Results  Component Value Date   WBC 7.3 08/05/2012   HGB 14.0 08/05/2012   HCT 42.0 08/05/2012    MCV 85.8 08/05/2012   PLT 210 08/05/2012      Chemistry      Component Value Date/Time   NA 141 07/29/2012 1511   NA 135 06/11/2012 1534   K 3.6 07/29/2012 1511   K 4.3 06/11/2012 1534   CL 106 07/29/2012 1511   CL 96 06/11/2012 1534   CO2 24 07/29/2012 1511   CO2 29 06/11/2012 1534   BUN 18.0 07/29/2012 1511   BUN 18 06/11/2012 1534   CREATININE 1.0 07/29/2012 1511   CREATININE 0.79 06/11/2012 1534      Component Value Date/Time   CALCIUM 9.0 07/29/2012 1511   CALCIUM 9.1 06/11/2012 1534   ALKPHOS 71 07/29/2012 1511   ALKPHOS 76 06/11/2012 1534   AST 12 07/29/2012 1511   AST 18 06/11/2012 1534   ALT 26 07/29/2012 1511   ALT 38 06/11/2012 1534   BILITOT 0.60 07/29/2012 1511   BILITOT 0.3 06/11/2012 1534      RADIOGRAPHIC STUDIES:   ASSESSMENT: 31 year old gentleman with 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 Overall he is doing well tolerating the Temodar quite nicely except for some fatigue.   PLAN:  #1 patient will continue Temodar 75 mg per meter squared on a daily basis including the weekends. He will continue to take his antinausea medications as well.  #2 I will see him back in one week's time.   All questions were answered. The patient knows to call the clinic with any problems, questions or concerns. We can certainly see the patient much sooner if necessary.  I spent 25 minutes counseling the patient face to face. The total time spent in the appointment was 30 minutes.    Drue Second, MD Medical/Oncology [facility] [phone] (beeper) [phone] (Office)  08/05/2012, 9:35 AM

## 2012-08-06 ENCOUNTER — Ambulatory Visit
Admission: RE | Admit: 2012-08-06 | Discharge: 2012-08-06 | Disposition: A | Discharge status: Home / Self Care | Payer: PRIVATE HEALTH INSURANCE | Source: Ambulatory Visit | Attending: Radiation Oncology | Admitting: Radiation Oncology

## 2012-08-06 ENCOUNTER — Encounter: Payer: Self-pay | Admitting: Radiation Oncology

## 2012-08-06 VITALS — BP 115/90 | HR 82 | Resp 18 | Wt 199.2 lb

## 2012-08-06 DIAGNOSIS — C719 Malignant neoplasm of brain, unspecified: Secondary | ICD-10-CM

## 2012-08-06 NOTE — Progress Notes (Signed)
  Radiation Oncology         [phone]) [phone] ________________________________  Name: Brian Hanson MRN: [id number]  Date: 08/06/2012  DOB: 05-01-1981  Weekly Radiation Therapy Management  Current Dose: 32.4 Gy     Planned Dose:  59.4 Gy  Narrative . . . . . . . . The patient presents for routine under treatment assessment.                                                Patient alert and oriented to person, place, and time. No distress noted. Steady gait noted. Pleasant affect noted. Patient denies pain at this time. However, patient reports a mild headache. Patient denies dizziness. Diastolic blood pressure elevated. Patient denies nausea or vomiting. Patient reports he got an average of seven hours uninterrupted restful sleep. Patient reports taking temodar and keppra as directed. Also, patient reports taking decadron 2 mg once per day. Patient reports that yesterday he felt a heaviness in his forehead. Patient denies diplopia and floaters. Patient reports a good appetite. Stable weight noted. Patient reports fatigue                                 Set-up films were reviewed.                                 The chart was checked. Physical Findings. . .  weight is 199 lb 3.2 oz (90.357 kg). His blood pressure is 115/90 and his pulse is 82. His respiration is 18. . Weight essentially stable.  No significant changes. Impression . . . . . . . The patient is  tolerating radiation. Plan . . . . . . . . . . . . Continue treatment as planned.  I discontinued decadron today.  ________________________________  Artist Pais. Kathrynn Running, M.D.

## 2012-08-06 NOTE — Progress Notes (Signed)
Received patient and his wife in the clinic today for PUT with Dr. Kathrynn Running. Patient alert and oriented to person, place, and time. No distress noted. Steady gait noted. Pleasant affect noted. Patient denies pain at this time. However, patient reports a mild headache. Patient denies dizziness. Diastolic blood pressure elevated. Patient denies nausea or vomiting. Patient reports he got an average of seven hours uninterrupted restful sleep. Patient reports taking temodar and keppra as directed. Also, patient reports taking decadron 2 mg once per day. Patient reports that yesterday he felt a heaviness in his forehead. Patient denies diplopia and floaters. Patient reports a good appetite. Stable weight noted. Patient reports fatigue. Reported all findings to Dr. Kathrynn Running.

## 2012-08-09 ENCOUNTER — Ambulatory Visit
Admission: RE | Admit: 2012-08-09 | Discharge: 2012-08-09 | Disposition: A | Discharge status: Home / Self Care | Payer: PRIVATE HEALTH INSURANCE | Source: Ambulatory Visit | Attending: Radiation Oncology | Admitting: Radiation Oncology

## 2012-08-09 ENCOUNTER — Encounter: Payer: Self-pay | Admitting: Radiation Oncology

## 2012-08-09 VITALS — BP 108/80 | HR 97 | Temp 98.1°F | Resp 18 | Wt 200.1 lb

## 2012-08-09 DIAGNOSIS — M869 Osteomyelitis, unspecified: Secondary | ICD-10-CM

## 2012-08-09 DIAGNOSIS — C719 Malignant neoplasm of brain, unspecified: Secondary | ICD-10-CM

## 2012-08-09 NOTE — Progress Notes (Signed)
  Radiation Oncology         [phone] ________________________________  Name: Brian Hanson MRN: [id number]  Date: 08/09/2012  DOB: [birth year]  Weekly Radiation Therapy Management  Narrative . . . . . . . . The patient presents complaining of low grade temp of 100.6 last night which has normalized.                                 Set-up films were reviewed.                                 The chart was checked. Physical Findings. . .  weight is 200 lb 1.6 oz (90.765 kg). His oral temperature is 98.1 F (36.7 C). His blood pressure is 108/80 and his pulse is 97. His respiration is 18. . Weight essentially stable.  No significant changes. Impression . . . . . . . The patient is tolerating radiation. Plan . . . . . . . . . . . . Continue treatment as planned.  Will re-check CBC tomorrow, given patient's history of osteomyelitis.  ________________________________  Artist Pais. Kathrynn Running, M.D.

## 2012-08-09 NOTE — Progress Notes (Signed)
Patient presents to the clinic today accompanied by his wife requesting to be seen due to headache, lack of sleep and nausea. Patient alert and oriented to person, place, and time. No distress noted. Steady gait noted. Pleasant affect noted. Patient reports a "heavy" headache on the left side 6 on a scale of 0-10. Patient reports that he took tylenol today at 1100 to relieve this but, it didn't help. Patient reports that he took his last dose of Decadron 2 mg on Friday. Patient reports that he has been unable to sleep for the last two days. Patient denies floaters or diplopia. Patient denies hearing changes. Wife concerned about change in color of skin on right side of forehead. Assured wife this was normal hyperpigmentation associated with radiation therapy. Patient's wife reports the patient ran a fever last night of 100.6 for which tylenol 650 mg relieved. Also, the wife reports the patient experience nausea at 0330 this morning for which he took zofran. Reported all findings to Dr. Kathrynn Running.

## 2012-08-09 NOTE — Patient Instructions (Signed)
Call if fever of greater than 101.5

## 2012-08-10 ENCOUNTER — Ambulatory Visit
Admission: RE | Admit: 2012-08-10 | Discharge: 2012-08-10 | Disposition: A | Discharge status: Home / Self Care | Payer: PRIVATE HEALTH INSURANCE | Source: Ambulatory Visit | Attending: Radiation Oncology | Admitting: Radiation Oncology

## 2012-08-10 DIAGNOSIS — C719 Malignant neoplasm of brain, unspecified: Secondary | ICD-10-CM

## 2012-08-10 DIAGNOSIS — M869 Osteomyelitis, unspecified: Secondary | ICD-10-CM

## 2012-08-10 LAB — CBC WITH DIFFERENTIAL/PLATELET
Basophils Absolute: 0 10*3/uL (ref 0.0–0.1)
EOS%: 2.8 % (ref 0.0–7.0)
Eosinophils Absolute: 0.2 10*3/uL (ref 0.0–0.5)
HGB: 14.9 g/dL (ref 13.0–17.1)
MCH: 28.3 pg (ref 27.2–33.4)
MONO#: 0.8 10*3/uL (ref 0.1–0.9)
NEUT#: 4 10*3/uL (ref 1.5–6.5)
RDW: 15.3 % — ABNORMAL HIGH (ref 11.0–14.6)
WBC: 6.5 10*3/uL (ref 4.0–10.3)
lymph#: 1.5 10*3/uL (ref 0.9–3.3)

## 2012-08-10 NOTE — Progress Notes (Signed)
Quick Note:  Please call patient with normal result.  Thanks. MM ______ 

## 2012-08-11 ENCOUNTER — Ambulatory Visit: Payer: PRIVATE HEALTH INSURANCE

## 2012-08-11 ENCOUNTER — Ambulatory Visit
Admission: RE | Admit: 2012-08-11 | Discharge: 2012-08-11 | Disposition: A | Discharge status: Home / Self Care | Payer: PRIVATE HEALTH INSURANCE | Source: Ambulatory Visit | Attending: Radiation Oncology | Admitting: Radiation Oncology

## 2012-08-11 ENCOUNTER — Telehealth: Payer: Self-pay | Admitting: *Deleted

## 2012-08-11 NOTE — Telephone Encounter (Signed)
Per Dr Broadus John instruction, left vm informing pt his 08/10/12 lab results are normal. Left call back #.

## 2012-08-12 ENCOUNTER — Ambulatory Visit: Payer: PRIVATE HEALTH INSURANCE

## 2012-08-12 ENCOUNTER — Telehealth: Payer: Self-pay | Admitting: *Deleted

## 2012-08-12 ENCOUNTER — Encounter: Payer: Self-pay | Admitting: Radiation Oncology

## 2012-08-12 ENCOUNTER — Other Ambulatory Visit (HOSPITAL_COMMUNITY): Payer: PRIVATE HEALTH INSURANCE

## 2012-08-12 ENCOUNTER — Encounter: Payer: Self-pay | Admitting: Oncology

## 2012-08-12 ENCOUNTER — Telehealth: Payer: Self-pay | Admitting: Oncology

## 2012-08-12 ENCOUNTER — Encounter: Payer: PRIVATE HEALTH INSURANCE | Admitting: Radiation Oncology

## 2012-08-12 ENCOUNTER — Other Ambulatory Visit (HOSPITAL_BASED_OUTPATIENT_CLINIC_OR_DEPARTMENT_OTHER): Payer: PRIVATE HEALTH INSURANCE | Admitting: Lab

## 2012-08-12 ENCOUNTER — Inpatient Hospital Stay
Admission: RE | Admit: 2012-08-12 | Discharge: 2012-08-12 | Payer: PRIVATE HEALTH INSURANCE | Source: Ambulatory Visit | Attending: Radiation Oncology | Admitting: Radiation Oncology

## 2012-08-12 ENCOUNTER — Ambulatory Visit (HOSPITAL_BASED_OUTPATIENT_CLINIC_OR_DEPARTMENT_OTHER): Payer: PRIVATE HEALTH INSURANCE | Admitting: Oncology

## 2012-08-12 VITALS — BP 115/82 | HR 101 | Temp 98.8°F | Resp 20 | Ht 69.0 in | Wt 196.8 lb

## 2012-08-12 DIAGNOSIS — J189 Pneumonia, unspecified organism: Secondary | ICD-10-CM

## 2012-08-12 DIAGNOSIS — R509 Fever, unspecified: Secondary | ICD-10-CM

## 2012-08-12 DIAGNOSIS — C719 Malignant neoplasm of brain, unspecified: Secondary | ICD-10-CM

## 2012-08-12 DIAGNOSIS — C711 Malignant neoplasm of frontal lobe: Secondary | ICD-10-CM

## 2012-08-12 DIAGNOSIS — R51 Headache: Secondary | ICD-10-CM

## 2012-08-12 DIAGNOSIS — M79609 Pain in unspecified limb: Secondary | ICD-10-CM

## 2012-08-12 LAB — COMPREHENSIVE METABOLIC PANEL (CC13)
Albumin: 3.6 g/dL (ref 3.5–5.0)
BUN: 13 mg/dL (ref 7.0–26.0)
Calcium: 9.3 mg/dL (ref 8.4–10.4)
Chloride: 104 mEq/L (ref 98–107)
Glucose: 101 mg/dl — ABNORMAL HIGH (ref 70–99)
Potassium: 4.1 mEq/L (ref 3.5–5.1)

## 2012-08-12 LAB — CBC WITH DIFFERENTIAL/PLATELET
Basophils Absolute: 0 10*3/uL (ref 0.0–0.1)
Eosinophils Absolute: 0.1 10*3/uL (ref 0.0–0.5)
HGB: 14.2 g/dL (ref 13.0–17.1)
MCV: 85.2 fL (ref 79.3–98.0)
MONO%: 10.1 % (ref 0.0–14.0)
NEUT#: 4.2 10*3/uL (ref 1.5–6.5)
RDW: 15.2 % — ABNORMAL HIGH (ref 11.0–14.6)

## 2012-08-12 MED ORDER — AMOXICILLIN-POT CLAVULANATE 875-125 MG PO TABS: 1.0000 | ORAL_TABLET | Freq: Two times a day (BID) | ORAL | Status: DC

## 2012-08-12 MED ORDER — SODIUM CHLORIDE 0.9 % IV SOLN
INTRAVENOUS | Status: DC
  Administered 2012-08-12: 500 mL via INTRAVENOUS

## 2012-08-12 NOTE — Progress Notes (Signed)
  Radiation Oncology         [phone] ________________________________  Name: Brian Hanson MRN: [id number]  Date: 08/12/2012  DOB: [birth year]  Weekly Radiation Therapy Management  Current Dose: 39.6 Gy     Planned Dose:  59.4 Gy  Narrative . . . . . . . . The patient presents for routine under treatment assessment.                                       States no pain presently but reports this week he has experienced headaches involving the left side of his head and left eye and c/o of pain/aching in his left hand and left leg Denies any nausea and vomiting. Currently receiving hydration fluid as ordered by medical oncology. Reports fevers in the pm and had a temp of 100.6 last pm. Afebrile at 98.2 presently. Mr Correia states he is "not feeling good" and has a poor appetite Given CXR, IVF, Augmentin, Brain MRI, and short follow-up with Welton Flakes                                 Set-up films were reviewed.                                 The chart was checked. Physical Findings. . . . Weight essentially stable.  No significant changes. Impression . . . . . . . The patient is  tolerating radiation. Plan . . . . . . . . . . . . Continue treatment as planned.   Will follow-up on CXR and Brain MRI to assess findings and act accordingly.  ________________________________  Artist Pais Kathrynn Running, M.D.

## 2012-08-12 NOTE — Patient Instructions (Signed)
[facility] Discharge Instructions for Patients Receiving IV fluids Today you received the following Saline solution To help prevent nausea and vomiting after your treatment, we encourage you to take your nausea medication  As per Dr. Welton Flakes.   If you develop nausea and vomiting that is not controlled by your nausea medication, call the clinic. If it is after clinic hours your family physician or the after hours number for the clinic or go to the Emergency Department.   BELOW ARE SYMPTOMS THAT SHOULD BE REPORTED IMMEDIATELY:  *FEVER GREATER THAN 100.5 F  *CHILLS WITH OR WITHOUT FEVER  NAUSEA AND VOMITING THAT IS NOT CONTROLLED WITH YOUR NAUSEA MEDICATION  *UNUSUAL SHORTNESS OF BREATH  *UNUSUAL BRUISING OR BLEEDING  TENDERNESS IN MOUTH AND THROAT WITH OR WITHOUT PRESENCE OF ULCERS  *URINARY PROBLEMS  *BOWEL PROBLEMS  UNUSUAL RASH Items with * indicate a potential emergency and should be followed up as soon as possible.  . Feel free to call the clinic you have any questions or concerns. The clinic phone number is [phone].   I have been informed and understand all the instructions given to me. I know to contact the clinic, my physician, or go to the Emergency Department if any problems should occur. I do not have any questions at this time, but understand that I may call the clinic during office hours   should I have any questions or need assistance in obtaining follow up care.    __________________________________________  _____________  __________ Signature of Patient or Authorized Representative            Date                   Time    __________________________________________ Nurse's Signature

## 2012-08-12 NOTE — Patient Instructions (Addendum)
IVF today for dehydration  Chest X-ray today  MRI of brain for headaches  Augmentin  Twice a day for 10 days  See me back on 10/28

## 2012-08-12 NOTE — Progress Notes (Signed)
States no pain presently but reports this week he has experienced headaches involving the left side of his head and left eye and c/o of pain/aching in his left hand and left leg  Denies any nausea and vomiting.  Currently receiving hydration fluid as ordered by medical oncology.   Reports fevers in the pm and had a temp of 100.6 last pm.  Afebrile at 98.2 presently.   Brian Hanson states he is "not feeling good" and has a poor appetite

## 2012-08-12 NOTE — Telephone Encounter (Signed)
gve the pt her oct 2013 appt calendar along with the mri chest x-ray appt

## 2012-08-12 NOTE — Telephone Encounter (Signed)
Kendal Hymen called reporting patient has sent a text message to [facility] Reporting symptoms of a sinus infection.  He says he has a low grade temp, chills, sinus headache and says someone needs to see him.  Informed her he has left Med. Onc. Area per infusion area and does not have f/u with [place] or RT tomorrow.  Collaborative nurse notified of this call and reports Mr. Hence did discuss this information at today's f/u visit.

## 2012-08-13 ENCOUNTER — Ambulatory Visit
Admission: RE | Admit: 2012-08-13 | Discharge: 2012-08-13 | Disposition: A | Discharge status: Home / Self Care | Payer: PRIVATE HEALTH INSURANCE | Source: Ambulatory Visit | Attending: Radiation Oncology | Admitting: Radiation Oncology

## 2012-08-13 ENCOUNTER — Ambulatory Visit (HOSPITAL_COMMUNITY)
Admission: RE | Admit: 2012-08-13 | Discharge: 2012-08-13 | Disposition: A | Discharge status: Home / Self Care | Payer: PRIVATE HEALTH INSURANCE | Source: Ambulatory Visit | Attending: Oncology | Admitting: Oncology

## 2012-08-13 ENCOUNTER — Telehealth: Payer: Self-pay | Admitting: Oncology

## 2012-08-13 ENCOUNTER — Ambulatory Visit: Payer: PRIVATE HEALTH INSURANCE

## 2012-08-13 ENCOUNTER — Telehealth: Payer: Self-pay | Admitting: *Deleted

## 2012-08-13 DIAGNOSIS — J189 Pneumonia, unspecified organism: Secondary | ICD-10-CM

## 2012-08-13 DIAGNOSIS — C719 Malignant neoplasm of brain, unspecified: Secondary | ICD-10-CM

## 2012-08-13 DIAGNOSIS — R05 Cough: Secondary | ICD-10-CM | POA: Insufficient documentation

## 2012-08-13 DIAGNOSIS — R509 Fever, unspecified: Secondary | ICD-10-CM | POA: Insufficient documentation

## 2012-08-13 DIAGNOSIS — R059 Cough, unspecified: Secondary | ICD-10-CM | POA: Insufficient documentation

## 2012-08-13 MED ORDER — GADOBENATE DIMEGLUMINE 529 MG/ML IV SOLN
19.0000 mL | Freq: Once | INTRAVENOUS | Status: AC | PRN
  Administered 2012-08-13: 19 mL via INTRAVENOUS

## 2012-08-13 NOTE — Telephone Encounter (Signed)
Received call from pt's wife who states Dr Lillia [place] gave pt note yesterday stating Dexamethasone 4 mg, pt to take 1/2 tab twice a day, disp #30. Wife inquiring if Dr Kathrynn Running intended for pt to begin med today. Advised wife that pt should begin med today. Wife verbalized understanding.

## 2012-08-13 NOTE — Telephone Encounter (Signed)
S/w the pt's brother and he is aware of the mri and the chest x-ray appt

## 2012-08-16 ENCOUNTER — Ambulatory Visit
Admission: RE | Admit: 2012-08-16 | Discharge: 2012-08-16 | Disposition: A | Discharge status: Home / Self Care | Payer: PRIVATE HEALTH INSURANCE | Source: Ambulatory Visit | Attending: Radiation Oncology | Admitting: Radiation Oncology

## 2012-08-16 ENCOUNTER — Telehealth: Payer: Self-pay | Admitting: *Deleted

## 2012-08-16 ENCOUNTER — Ambulatory Visit (HOSPITAL_COMMUNITY): Admission: RE | Admit: 2012-08-16 | Payer: PRIVATE HEALTH INSURANCE | Source: Ambulatory Visit

## 2012-08-16 ENCOUNTER — Telehealth: Payer: Self-pay | Admitting: Oncology

## 2012-08-16 ENCOUNTER — Ambulatory Visit (HOSPITAL_BASED_OUTPATIENT_CLINIC_OR_DEPARTMENT_OTHER): Payer: PRIVATE HEALTH INSURANCE | Admitting: Oncology

## 2012-08-16 ENCOUNTER — Ambulatory Visit: Payer: PRIVATE HEALTH INSURANCE

## 2012-08-16 VITALS — BP 112/83 | HR 109 | Temp 98.3°F | Resp 20 | Ht 69.0 in | Wt 195.3 lb

## 2012-08-16 DIAGNOSIS — C711 Malignant neoplasm of frontal lobe: Secondary | ICD-10-CM

## 2012-08-16 DIAGNOSIS — J329 Chronic sinusitis, unspecified: Secondary | ICD-10-CM

## 2012-08-16 DIAGNOSIS — I82409 Acute embolism and thrombosis of unspecified deep veins of unspecified lower extremity: Secondary | ICD-10-CM

## 2012-08-16 DIAGNOSIS — R51 Headache: Secondary | ICD-10-CM

## 2012-08-16 MED ORDER — LORAZEPAM 0.5 MG PO TABS: 0.5000 mg | ORAL_TABLET | Freq: Three times a day (TID) | ORAL | Status: DC

## 2012-08-16 MED ORDER — HYDROCODONE-ACETAMINOPHEN 5-500 MG PO CAPS: 1.0000 | ORAL_CAPSULE | Freq: Four times a day (QID) | ORAL | Status: DC | PRN

## 2012-08-16 MED ORDER — DEXAMETHASONE 4 MG PO TABS: ORAL_TABLET | ORAL | Status: DC

## 2012-08-16 NOTE — Telephone Encounter (Signed)
Message copied by Cooper Render on Mon Aug 16, 2012 11:44 AM ------      Message from: Victorino December      Created: Mon Aug 16, 2012 11:09 AM       Call patient: chest x-ray no evidence of pnumonia

## 2012-08-16 NOTE — Telephone Encounter (Signed)
Per MD, notified pt chest xray-no evidence of pneumonia.

## 2012-08-16 NOTE — Patient Instructions (Addendum)
Proceed with taking dexaqmethasone 4 mg every 6 hours  Take hydrocodone for pain, take it before going to bed  Finish the antibiotic for the remaining days

## 2012-08-16 NOTE — Telephone Encounter (Signed)
gve the appt calendar for the venous dopplar of the left leg today at [place] to the rad onc tech whrer the pt was getting his tx.

## 2012-08-17 ENCOUNTER — Ambulatory Visit: Payer: PRIVATE HEALTH INSURANCE

## 2012-08-17 ENCOUNTER — Telehealth: Payer: Self-pay | Admitting: *Deleted

## 2012-08-17 ENCOUNTER — Ambulatory Visit
Admission: RE | Admit: 2012-08-17 | Discharge: 2012-08-17 | Disposition: A | Discharge status: Home / Self Care | Payer: PRIVATE HEALTH INSURANCE | Source: Ambulatory Visit | Attending: Radiation Oncology | Admitting: Radiation Oncology

## 2012-08-17 ENCOUNTER — Ambulatory Visit (HOSPITAL_COMMUNITY)
Admission: RE | Admit: 2012-08-17 | Discharge: 2012-08-17 | Disposition: A | Discharge status: Home / Self Care | Payer: PRIVATE HEALTH INSURANCE | Source: Ambulatory Visit | Attending: Oncology | Admitting: Oncology

## 2012-08-17 DIAGNOSIS — M7989 Other specified soft tissue disorders: Secondary | ICD-10-CM | POA: Insufficient documentation

## 2012-08-17 DIAGNOSIS — Z86718 Personal history of other venous thrombosis and embolism: Secondary | ICD-10-CM | POA: Insufficient documentation

## 2012-08-17 DIAGNOSIS — M79609 Pain in unspecified limb: Secondary | ICD-10-CM | POA: Insufficient documentation

## 2012-08-17 DIAGNOSIS — I82409 Acute embolism and thrombosis of unspecified deep veins of unspecified lower extremity: Secondary | ICD-10-CM

## 2012-08-17 DIAGNOSIS — C719 Malignant neoplasm of brain, unspecified: Secondary | ICD-10-CM

## 2012-08-17 NOTE — Telephone Encounter (Signed)
Message copied by Cooper Render on Tue Aug 17, 2012  4:47 PM ------      Message from: Victorino December      Created: Tue Aug 17, 2012 12:13 PM      Regarding: RE: Vascular Lab       Please let patient know the results            Thx      ----- Message -----         From: Jolinda Croak, RN         Sent: 08/17/2012  11:26 AM           To: Victorino December, MD      Subject: Vascular Lab                                             Call from vascular lab- Pt is negative for Bakers cyst/ blood clot

## 2012-08-17 NOTE — Progress Notes (Signed)
Left:  No evidence of DVT, superficial thrombosis, or Baker's cyst.  Right:  Negative for DVT in the common femoral vein.  

## 2012-08-17 NOTE — Telephone Encounter (Signed)
Unable to reach pt at either number listed.

## 2012-08-18 ENCOUNTER — Ambulatory Visit: Payer: PRIVATE HEALTH INSURANCE

## 2012-08-18 ENCOUNTER — Ambulatory Visit
Admission: RE | Admit: 2012-08-18 | Discharge: 2012-08-18 | Disposition: A | Discharge status: Home / Self Care | Payer: PRIVATE HEALTH INSURANCE | Source: Ambulatory Visit | Attending: Radiation Oncology | Admitting: Radiation Oncology

## 2012-08-19 ENCOUNTER — Ambulatory Visit: Payer: PRIVATE HEALTH INSURANCE

## 2012-08-19 ENCOUNTER — Ambulatory Visit
Admission: RE | Admit: 2012-08-19 | Discharge: 2012-08-19 | Disposition: A | Discharge status: Home / Self Care | Payer: PRIVATE HEALTH INSURANCE | Source: Ambulatory Visit | Attending: Radiation Oncology | Admitting: Radiation Oncology

## 2012-08-19 ENCOUNTER — Other Ambulatory Visit (HOSPITAL_BASED_OUTPATIENT_CLINIC_OR_DEPARTMENT_OTHER): Payer: PRIVATE HEALTH INSURANCE | Admitting: Lab

## 2012-08-19 ENCOUNTER — Encounter: Payer: Self-pay | Admitting: Adult Health

## 2012-08-19 ENCOUNTER — Ambulatory Visit (HOSPITAL_BASED_OUTPATIENT_CLINIC_OR_DEPARTMENT_OTHER): Payer: PRIVATE HEALTH INSURANCE | Admitting: Adult Health

## 2012-08-19 VITALS — BP 123/84 | HR 77 | Temp 98.2°F | Resp 20 | Ht 69.0 in | Wt 197.0 lb

## 2012-08-19 DIAGNOSIS — M79606 Pain in leg, unspecified: Secondary | ICD-10-CM

## 2012-08-19 DIAGNOSIS — C711 Malignant neoplasm of frontal lobe: Secondary | ICD-10-CM

## 2012-08-19 DIAGNOSIS — C719 Malignant neoplasm of brain, unspecified: Secondary | ICD-10-CM

## 2012-08-19 DIAGNOSIS — R52 Pain, unspecified: Secondary | ICD-10-CM

## 2012-08-19 LAB — COMPREHENSIVE METABOLIC PANEL (CC13)
AST: 10 U/L (ref 5–34)
Albumin: 3.9 g/dL (ref 3.5–5.0)
BUN: 15 mg/dL (ref 7.0–26.0)
Calcium: 9.5 mg/dL (ref 8.4–10.4)
Chloride: 108 mEq/L — ABNORMAL HIGH (ref 98–107)
Glucose: 120 mg/dl — ABNORMAL HIGH (ref 70–99)
Potassium: 4 mEq/L (ref 3.5–5.1)
Total Protein: 7 g/dL (ref 6.4–8.3)

## 2012-08-19 LAB — CBC WITH DIFFERENTIAL/PLATELET
Basophils Absolute: 0 10*3/uL (ref 0.0–0.1)
Eosinophils Absolute: 0 10*3/uL (ref 0.0–0.5)
HGB: 14.3 g/dL (ref 13.0–17.1)
NEUT#: 7.5 10*3/uL — ABNORMAL HIGH (ref 1.5–6.5)
RDW: 14.2 % (ref 11.0–14.6)
WBC: 9 10*3/uL (ref 4.0–10.3)
lymph#: 0.8 10*3/uL — ABNORMAL LOW (ref 0.9–3.3)

## 2012-08-19 MED ORDER — HYDROCODONE-ACETAMINOPHEN 5-500 MG PO CAPS: 1.0000 | ORAL_CAPSULE | Freq: Four times a day (QID) | ORAL | Status: DC | PRN

## 2012-08-19 NOTE — Patient Instructions (Addendum)
Continue Dexamethasone.  We will taper this after you complete radiation.  Please call us if you have any questions or concerns.

## 2012-08-19 NOTE — Progress Notes (Signed)
OFFICE PROGRESS NOTE  CC Dr. 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 In June 2013 patient had developed headaches and an MRI showed 5.0 x 5.0 cm mass. He was seen at [facility] by Dr. Hassel Neth and underwent a resection. He subsequently was referred to [place] and has seen Dr. Margaretmary Bayley for consideration of radiation.  CURRENT THERAPY: Temodar concurrently with radiation.  INTERVAL HISTORY: Brian Hanson 31 y.o. male returns for followup visit. Patient currently is taking Temodar 75 mg per meter squared along with his radiation. His Temodar is daily 7 days a week. He will continue this for the duration. He had previously had headaches and left arm/leg pain.  He was subsequently started on Dexamethasone 4mg  PO Q6H.  He is taking this and his headaches have subsided.  He continues to have arm and leg pain, an US of the LLE was negative for DVT.  He is taking Hydrocodone for this and is ok.  Otherwise he is doing well.    MEDICAL HISTORY: Past Medical History  Diagnosis Date  . Brain tumor   . 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 Pneumonia 04/2012  . Seizures 09/2010  . Rhabdomyolysis     s/p hospitalization 09/2010  . History of hemodialysis 09/2010    x 3 s/p renal failure  . GERD (gastroesophageal reflux disease)   . Anxiety   . Astrocytoma brain tumor     ALLERGIES:  is allergic to milk-related compounds and dilantin.  MEDICATIONS:  Current Outpatient Prescriptions  Medication Sig Dispense Refill  . amoxicillin-clavulanate (AUGMENTIN) 875-125 MG per tablet Take 1 tablet by mouth  2 (two) times daily.  20 tablet  0  . dexamethasone (DECADRON) 4 MG tablet Take 1 tablet every 6 hours.  60 tablet  2  . hydrocodone-acetaminophen (LORCET-HD) 5-500 MG per capsule Take 1 capsule by mouth every 6 (six) hours as needed for pain.  30 capsule  0  . levETIRAcetam (KEPPRA) 500 MG tablet Take 500-1,000 mg by mouth 2 (two) times daily. 2 tabs 2 times daily for 10 days, Start date 06/01/12; If no seizures decrease to 1 tab 2 times daily until follow up appt      . LORazepam (ATIVAN) 0.5 MG tablet Take 1 tablet (0.5 mg total) by mouth every 8 (eight) hours.  30 tablet  0  . ondansetron (ZOFRAN) 4 MG tablet Take 4 mg by mouth every 8 (eight) hours as needed.      . pantoprazole (PROTONIX) 40 MG tablet Take 40 mg by mouth daily.      Marland Kitchen temozolomide (TEMODAR) 140 MG capsule Take 1 capsule (140 mg total) by mouth daily. May take on an empty stomach or at bedtime to decrease nausea & vomiting.  40 capsule  0  . temozolomide (TEMODAR) 20 MG capsule Take 1 capsule (20 mg total) by mouth daily. May take on an empty stomach or at bedtime to decrease nausea & vomiting.  40 capsule  2    SURGICAL HISTORY:  Past Surgical History  Procedure Date  . Brain surgery 10/22/2010    [place]  . Craniotomy 05/30/12  Dr Lavonna Monarch, [facility]    REVIEW OF SYSTEMS:   General: fatigue (-), night sweats (-), fever (-), pain (-) Lymph: palpable nodes (-) HEENT: vision changes (-), mucositis (-), gum bleeding (-), epistaxis (-) Cardiovascular: chest pain (-), palpitations (-) Pulmonary: shortness of breath (-), dyspnea on exertion (-), cough (-), hemoptysis (-) GI:  Early satiety (-), melena (-), dysphagia (-), nausea/vomiting (-), diarrhea (-) GU: dysuria (-), hematuria (-), incontinence (-) Musculoskeletal: joint swelling (-), joint pain (-), back pain (-) Neuro: weakness (-), numbness (-), headache (-), confusion (-) Skin: Rash (-), lesions (-), dryness (-) Psych: depression (-), suicidal/homicidal  ideation (-), feeling of hopelessness (-)   PHYSICAL EXAMINATION: Blood pressure 123/84, pulse 77, temperature 98.2 F (36.8 C), temperature source Oral, resp. rate 20, height 5\' 9"  (1.753 m), weight 197 lb (89.359 kg). Body mass index is 29.09 kg/(m^2). General: Patient is a well appearing male in no acute distress HEENT: PERRLA, sclerae anicteric no conjunctival pallor, MMM Neck: supple, no palpable adenopathy Lungs: clear to auscultation bilaterally, no wheezes, rhonchi, or rales Cardiovascular: regular rate rhythm, S1, S2, no murmurs, rubs or gallops Abdomen: Soft, non-tender, non-distended, normoactive bowel sounds, no HSM Extremities: warm and well perfused, no clubbing, cyanosis, or edema Skin: No rashes or lesions Neuro: Non-focal, CN II-XII intact ECOG PERFORMANCE STATUS: 1 - Symptomatic but completely ambulatory      LABORATORY DATA: Lab Results  Component Value Date   WBC 9.0 08/19/2012   HGB 14.3 08/19/2012   HCT 42.3 08/19/2012   MCV 83.4 08/19/2012   PLT 280 08/19/2012      Chemistry      Component Value Date/Time   NA 140 08/12/2012 1315   NA 135 06/11/2012 1534   K 4.1 08/12/2012 1315   K 4.3 06/11/2012 1534   CL 104 08/12/2012 1315   CL 96 06/11/2012 1534   CO2 26 08/12/2012 1315   CO2 29 06/11/2012 1534   BUN 13.0 08/12/2012 1315   BUN 18 06/11/2012 1534   CREATININE 0.9 08/12/2012 1315   CREATININE 0.79 06/11/2012 1534      Component Value Date/Time   CALCIUM 9.3 08/12/2012 1315   CALCIUM 9.1 06/11/2012 1534   ALKPHOS 60 08/12/2012 1315   ALKPHOS 76 06/11/2012 1534   AST 13 08/12/2012 1315   AST 18 06/11/2012 1534   ALT 22 08/12/2012 1315   ALT 38 06/11/2012 1534   BILITOT 0.70 08/12/2012 1315   BILITOT 0.3 06/11/2012 1534      RADIOGRAPHIC STUDIES:   ASSESSMENT: 31 year old gentleman with 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 Overall he is doing well tolerating the Temodar quite nicely except for some fatigue.   PLAN:    #1 patient will continue Temodar 75 mg per meter squared on a daily basis including the weekends. He will continue to take his antinausea medications as well.  #2 He is improved on the steroids.  He will continue these until he completes radiation.  He will return next week.     All questions were answered. The patient knows to call the clinic with any problems, questions or concerns. We can certainly see the patient much sooner if necessary.  I spent 25 minutes counseling the patient face to face. The total time spent in the appointment was 30 minutes.   Cherie Ouch Lyn Hollingshead, NP Medical Oncology [facility] Phone: [phone] 08/19/2012, 2:14 PM

## 2012-08-20 ENCOUNTER — Ambulatory Visit
Admission: RE | Admit: 2012-08-20 | Discharge: 2012-08-20 | Disposition: A | Discharge status: Home / Self Care | Payer: PRIVATE HEALTH INSURANCE | Source: Ambulatory Visit | Attending: Radiation Oncology | Admitting: Radiation Oncology

## 2012-08-20 ENCOUNTER — Encounter: Payer: Self-pay | Admitting: Radiation Oncology

## 2012-08-20 ENCOUNTER — Ambulatory Visit: Payer: PRIVATE HEALTH INSURANCE

## 2012-08-20 VITALS — BP 119/88 | HR 75 | Temp 98.5°F | Resp 20 | Wt 198.8 lb

## 2012-08-20 DIAGNOSIS — C719 Malignant neoplasm of brain, unspecified: Secondary | ICD-10-CM

## 2012-08-20 NOTE — Progress Notes (Signed)
Patient here wekly rad tx, alert,oriented x3, no blurred vision, no nausea, does have mild head ache, takes tylenol po prn, on amoxicillin bid since 08/12/12 , Temodar , started protonix 40mg  daily recently, on decadron 4mg  po 4x day, no thrush seen in mouth, normal appetite, tanning on right front head, not using biafine , drinking plenty water 4:25 PM

## 2012-08-20 NOTE — Progress Notes (Signed)
  Radiation Oncology         [phone] ________________________________  Name: Brian Hanson  MRN: [id number]  Date: 08/20/2012  DOB: 14-Nov-1980  Weekly Radiation Therapy Management  Current Dose: 50.4 Gy     Planned Dose:  59.4 Gy  Narrative . . . . . . . . The patient presents for routine under treatment assessment.                                                    Patient here wekly rad tx, alert,oriented x3, no blurred vision, no nausea, does have mild head ache, takes tylenol po prn, on amoxicillin bid since 08/12/12 , Temodar , started protonix 40mg  daily recently, Increased  decadron to 4mg  po 4x day by [place] Flakes, no thrush seen in mouth, normal appetite, tanning on right front head, not using biafine , drinking plenty water                                 Set-up films were reviewed.                                 The chart was checked. Physical Findings. . .  weight is 198 lb 12.8 oz (90.175 kg). His oral temperature is 98.5 F (36.9 C). His blood pressure is 119/88 and his pulse is 75. His respiration is 20. . Weight essentially stable.  No significant changes. Impression . . . . . . . The patient is  tolerating radiation. Plan . . . . . . . . . . . . Continue treatment as planned.  ________________________________  Artist Pais. Kathrynn Running, M.D.

## 2012-08-22 NOTE — Progress Notes (Signed)
OFFICE PROGRESS NOTE  CC Dr. 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 In June 2013 patient had developed headaches and an MRI showed 5.0 x 5.0 cm mass. He was seen at [facility] by Dr. Hassel Neth and underwent a resection. He subsequently was referred to [place] and has seen Dr. Margaretmary Bayley for consideration of radiation.  CURRENT THERAPY: Temodar concurrently with radiation.  INTERVAL HISTORY: Brian Hanson 31 y.o. male returns for followup visit. Patient currently is taking Temodar 75 mg per meter squared along with his radiation.He has significant headaches and pain behind the left eye as well as left maxillary sinus pain. He is also complaining of pain in his left upper extremity and left lower extremity. He also has developed fevers. I do think he may have a upper respiratory tract infection sinus infection or just a URI. He certainly is at risk for any of this. My examination did reveal some tenderness in his sinus on the left side.  MEDICAL HISTORY: Past Medical History  Diagnosis Date  . Brain tumor   . 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 Pneumonia 04/2012  . Seizures 09/2010  . Rhabdomyolysis     s/p hospitalization 09/2010  . History of hemodialysis 09/2010    x 3 s/p renal failure  . GERD (gastroesophageal reflux disease)   . Anxiety   . Astrocytoma brain tumor     ALLERGIES:  is allergic to milk-related compounds and dilantin.  MEDICATIONS:  Current Outpatient Prescriptions  Medication Sig Dispense Refill  . levETIRAcetam (KEPPRA) 500 MG tablet Take 500-1,000 mg by mouth  2 (two) times daily. 2 tabs 2 times daily for 10 days, Start date 06/01/12; If no seizures decrease to 1 tab 2 times daily until follow up appt      . ondansetron (ZOFRAN) 4 MG tablet Take 4 mg by mouth every 8 (eight) hours as needed.      . pantoprazole (PROTONIX) 40 MG tablet Take 40 mg by mouth daily.      Marland Kitchen temozolomide (TEMODAR) 140 MG capsule Take 1 capsule (140 mg total) by mouth daily. May take on an empty stomach or at bedtime to decrease nausea & vomiting.  40 capsule  0  . temozolomide (TEMODAR) 20 MG capsule Take 1 capsule (20 mg total) by mouth daily. May take on an empty stomach or at bedtime to decrease nausea & vomiting.  40 capsule  2  . amoxicillin-clavulanate (AUGMENTIN) 875-125 MG per tablet Take 1 tablet by mouth 2 (two) times daily.  20 tablet  0  . dexamethasone (DECADRON) 4 MG tablet Take 1 tablet every 6 hours.  60 tablet  2  . hydrocodone-acetaminophen (LORCET-HD) 5-500 MG per capsule Take 1 capsule by mouth every 6 (six) hours as needed for pain.  30 capsule  0  . LORazepam (ATIVAN) 0.5 MG tablet Take 1 tablet (0.5 mg total) by mouth every 8 (eight) hours.  30 tablet  0    SURGICAL HISTORY:  Past Surgical History  Procedure Date  . Brain surgery 10/22/2010    [place]  . Craniotomy 05/30/12    Dr Lavonna Monarch, [facility]  REVIEW OF SYSTEMS:  Pertinent items are noted in HPI.   HEALTH MAINTENANCE:  PHYSICAL EXAMINATION: Blood pressure 115/82, pulse 101, temperature 98.8 F (37.1 C), temperature source Oral, resp. rate 20, height 5\' 9"  (1.753 m), weight 196 lb 12.8 oz (89.268 kg). Body mass index is 29.06 kg/(m^2). ECOG PERFORMANCE STATUS: 1 - Symptomatic but completely ambulatory   General appearance: alert, cooperative and appears stated age Resp: clear to auscultation bilaterally Back: symmetric, no curvature. ROM normal. No CVA tenderness. Cardio: regular rate and rhythm GI: soft, non-tender; bowel sounds normal; no masses,  no organomegaly Extremities:  extremities normal, atraumatic, no cyanosis or edema Neurologic: Grossly normal   LABORATORY DATA: Lab Results  Component Value Date   WBC 9.0 08/19/2012   HGB 14.3 08/19/2012   HCT 42.3 08/19/2012   MCV 83.4 08/19/2012   PLT 280 08/19/2012      Chemistry      Component Value Date/Time   NA 140 08/19/2012 1344   NA 135 06/11/2012 1534   K 4.0 08/19/2012 1344   K 4.3 06/11/2012 1534   CL 108* 08/19/2012 1344   CL 96 06/11/2012 1534   CO2 25 08/19/2012 1344   CO2 29 06/11/2012 1534   BUN 15.0 08/19/2012 1344   BUN 18 06/11/2012 1534   CREATININE 0.9 08/19/2012 1344   CREATININE 0.79 06/11/2012 1534      Component Value Date/Time   CALCIUM 9.5 08/19/2012 1344   CALCIUM 9.1 06/11/2012 1534   ALKPHOS 60 08/19/2012 1344   ALKPHOS 76 06/11/2012 1534   AST 10 08/19/2012 1344   AST 18 06/11/2012 1534   ALT 21 08/19/2012 1344   ALT 38 06/11/2012 1534   BILITOT 0.30 08/19/2012 1344   BILITOT 0.3 06/11/2012 1534      RADIOGRAPHIC STUDIES:   ASSESSMENT: 31 year old gentleman with:  #1. 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 Overall he is doing well tolerating the Temodar quite nicely except for some fatigue.  #2 headaches and left eye pain with left upper and lower extremity pain.  #3 fevers with maxillary sinus congestion.   PLAN:  #1 patient will continue Temodar 75 mg per meter squared on a daily basis including the weekends. He will continue to take his antinausea medications as well.  #2 For possible sinus infection he will begin Augmentin twice a day prescription was sent to his pharmacy.  # 3 headaches I will restart him on dexamethasone 4 mg every 6 hours. I emphasized to him to continue taking this until we tell him not to.  #4 lower leg pain I will get a Doppler ultrasound of the left leg to rule out a DVT.  #5 patient will also get an MRI of the brain to Rule out progression of disease.  All questions were answered. The patient knows to  call the clinic with any problems, questions or concerns. We can certainly see the patient much sooner if necessary.  I spent 25 minutes counseling the patient face to face. The total time spent in the appointment was 30 minutes.    Drue Second, MD Medical/Oncology [facility] [phone] (beeper) [phone] (Office)  08/22/2012, 5:11 PM

## 2012-08-22 NOTE — Progress Notes (Signed)
OFFICE PROGRESS NOTE  CC Dr. 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 In June 2013 patient had developed headaches and an MRI showed 5.0 x 5.0 cm mass. He was seen at [facility] by Dr. Hassel Neth and underwent a resection. He subsequently was referred to [place] and has seen Dr. Margaretmary Bayley for consideration of radiation.  CURRENT THERAPY: Temodar concurrently with radiation.  INTERVAL HISTORY: Esten Popescu 31 y.o. male returns for followup visit. Patient currently is taking Temodar 75 mg per meter squared along with his radiation.He is continuing to have some headaches but it is improved in comparison to previously. He also continues to have lower extremity pain but upon questioning he states that this has been ongoing and chronic for quite some time. His Doppler ultrasound was negative for a DVT. I do think he may have a certain amount of arthritis. He has no nausea no vomiting his fevers have significantly improved and stopped.  MEDICAL HISTORY: Past Medical History  Diagnosis Date  . Brain tumor   . 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 Pneumonia 04/2012  . Seizures 09/2010  . Rhabdomyolysis     s/p hospitalization 09/2010  . History of hemodialysis 09/2010    x 3 s/p renal failure  . GERD (gastroesophageal reflux disease)   . Anxiety   . Astrocytoma brain tumor     ALLERGIES:  is allergic to milk-related compounds and dilantin.  MEDICATIONS:  Current Outpatient Prescriptions  Medication Sig Dispense Refill  . amoxicillin-clavulanate (AUGMENTIN) 875-125 MG per tablet Take 1  tablet by mouth 2 (two) times daily.  20 tablet  0  . levETIRAcetam (KEPPRA) 500 MG tablet Take 500-1,000 mg by mouth 2 (two) times daily. 2 tabs 2 times daily for 10 days, Start date 06/01/12; If no seizures decrease to 1 tab 2 times daily until follow up appt      . ondansetron (ZOFRAN) 4 MG tablet Take 4 mg by mouth every 8 (eight) hours as needed.      . pantoprazole (PROTONIX) 40 MG tablet Take 40 mg by mouth daily.      Marland Kitchen temozolomide (TEMODAR) 140 MG capsule Take 1 capsule (140 mg total) by mouth daily. May take on an empty stomach or at bedtime to decrease nausea & vomiting.  40 capsule  0  . temozolomide (TEMODAR) 20 MG capsule Take 1 capsule (20 mg total) by mouth daily. May take on an empty stomach or at bedtime to decrease nausea & vomiting.  40 capsule  2  . dexamethasone (DECADRON) 4 MG tablet Take 1 tablet every 6 hours.  60 tablet  2  . hydrocodone-acetaminophen (LORCET-HD) 5-500 MG per capsule Take 1 capsule by mouth every 6 (six) hours as needed for pain.  30 capsule  0  . LORazepam (ATIVAN) 0.5 MG tablet Take 1 tablet (0.5 mg total) by mouth every 8 (eight) hours.  30 tablet  0    SURGICAL HISTORY:  Past Surgical History  Procedure Date  . Brain surgery 10/22/2010    [place]  . Craniotomy 05/30/12    Dr Lavonna Monarch, [facility]    REVIEW OF SYSTEMS:  Pertinent items are noted in HPI.   HEALTH MAINTENANCE:  PHYSICAL EXAMINATION: Blood pressure 112/83, pulse 109, temperature 98.3 F (36.8 C), temperature source Oral, resp. rate 20, height 5\' 9"  (1.753 m), weight 195 lb 4.8 oz (88.587 kg). Body mass index is 28.84 kg/(m^2). ECOG PERFORMANCE STATUS: 1 - Symptomatic but completely ambulatory   General appearance: alert, cooperative and appears stated age Resp: clear to auscultation bilaterally Back: symmetric, no curvature. ROM normal. No CVA tenderness. Cardio: regular rate and rhythm GI: soft, non-tender; bowel sounds normal; no masses,  no organomegaly Extremities:  extremities normal, atraumatic, no cyanosis or edema Neurologic: Grossly normal   LABORATORY DATA: Lab Results  Component Value Date   WBC 9.0 08/19/2012   HGB 14.3 08/19/2012   HCT 42.3 08/19/2012   MCV 83.4 08/19/2012   PLT 280 08/19/2012      Chemistry      Component Value Date/Time   NA 140 08/19/2012 1344   NA 135 06/11/2012 1534   K 4.0 08/19/2012 1344   K 4.3 06/11/2012 1534   CL 108* 08/19/2012 1344   CL 96 06/11/2012 1534   CO2 25 08/19/2012 1344   CO2 29 06/11/2012 1534   BUN 15.0 08/19/2012 1344   BUN 18 06/11/2012 1534   CREATININE 0.9 08/19/2012 1344   CREATININE 0.79 06/11/2012 1534      Component Value Date/Time   CALCIUM 9.5 08/19/2012 1344   CALCIUM 9.1 06/11/2012 1534   ALKPHOS 60 08/19/2012 1344   ALKPHOS 76 06/11/2012 1534   AST 10 08/19/2012 1344   AST 18 06/11/2012 1534   ALT 21 08/19/2012 1344   ALT 38 06/11/2012 1534   BILITOT 0.30 08/19/2012 1344   BILITOT 0.3 06/11/2012 1534      RADIOGRAPHIC STUDIES:   ASSESSMENT: 31 year old gentleman with:  #1. 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 Overall he is doing well tolerating the Temodar quite nicely except for some fatigue.  #2 headaches and left eye pain with left upper and lower extremity pain Improve  #3 fevers with maxillary sinus congestion Improving   PLAN:  #1 patient will continue Temodar 75 mg per meter squared on a daily basis including the weekends. He will continue to take his antinausea medications as well.  #2 sinus infection is improving he is on Augmentin I have recommended that he continue this and finished out the course.He will also continue the dexamethasone every 6 hours for milligrams.  #3 he will be seen back in one week's time  All questions were answered. The patient knows to call the clinic with any problems, questions or concerns. We can certainly see the patient much sooner if necessary.  I spent 25 minutes counseling the patient face to  face. The total time spent in the appointment was 30 minutes.    Drue Second, MD Medical/Oncology [facility] [phone] (beeper) [phone] (Office)

## 2012-08-23 ENCOUNTER — Ambulatory Visit
Admission: RE | Admit: 2012-08-23 | Discharge: 2012-08-23 | Disposition: A | Discharge status: Home / Self Care | Payer: PRIVATE HEALTH INSURANCE | Source: Ambulatory Visit | Attending: Radiation Oncology | Admitting: Radiation Oncology

## 2012-08-23 ENCOUNTER — Ambulatory Visit: Payer: PRIVATE HEALTH INSURANCE

## 2012-08-24 ENCOUNTER — Ambulatory Visit: Payer: PRIVATE HEALTH INSURANCE

## 2012-08-24 ENCOUNTER — Ambulatory Visit
Admission: RE | Admit: 2012-08-24 | Discharge: 2012-08-24 | Disposition: A | Discharge status: Home / Self Care | Payer: PRIVATE HEALTH INSURANCE | Source: Ambulatory Visit | Attending: Radiation Oncology | Admitting: Radiation Oncology

## 2012-08-25 ENCOUNTER — Ambulatory Visit: Payer: PRIVATE HEALTH INSURANCE

## 2012-08-25 ENCOUNTER — Ambulatory Visit
Admission: RE | Admit: 2012-08-25 | Discharge: 2012-08-25 | Disposition: A | Discharge status: Home / Self Care | Payer: PRIVATE HEALTH INSURANCE | Source: Ambulatory Visit | Attending: Radiation Oncology | Admitting: Radiation Oncology

## 2012-08-26 ENCOUNTER — Ambulatory Visit
Admission: RE | Admit: 2012-08-26 | Discharge: 2012-08-26 | Disposition: A | Discharge status: Home / Self Care | Payer: PRIVATE HEALTH INSURANCE | Source: Ambulatory Visit | Attending: Radiation Oncology | Admitting: Radiation Oncology

## 2012-08-26 ENCOUNTER — Ambulatory Visit (HOSPITAL_BASED_OUTPATIENT_CLINIC_OR_DEPARTMENT_OTHER): Payer: PRIVATE HEALTH INSURANCE | Admitting: Adult Health

## 2012-08-26 ENCOUNTER — Other Ambulatory Visit (HOSPITAL_BASED_OUTPATIENT_CLINIC_OR_DEPARTMENT_OTHER): Payer: PRIVATE HEALTH INSURANCE | Admitting: Lab

## 2012-08-26 ENCOUNTER — Encounter: Payer: Self-pay | Admitting: Adult Health

## 2012-08-26 ENCOUNTER — Ambulatory Visit: Payer: PRIVATE HEALTH INSURANCE

## 2012-08-26 ENCOUNTER — Encounter: Payer: Self-pay | Admitting: Radiation Oncology

## 2012-08-26 VITALS — BP 117/76 | HR 71 | Temp 98.3°F | Resp 20 | Wt 196.8 lb

## 2012-08-26 VITALS — BP 116/80 | HR 78 | Temp 97.9°F | Resp 20 | Ht 69.0 in | Wt 196.1 lb

## 2012-08-26 DIAGNOSIS — C719 Malignant neoplasm of brain, unspecified: Secondary | ICD-10-CM

## 2012-08-26 DIAGNOSIS — C711 Malignant neoplasm of frontal lobe: Secondary | ICD-10-CM

## 2012-08-26 LAB — CBC WITH DIFFERENTIAL/PLATELET
BASO%: 0.3 % (ref 0.0–2.0)
Basophils Absolute: 0 10*3/uL (ref 0.0–0.1)
HGB: 15 g/dL (ref 13.0–17.1)
LYMPH%: 3.7 % — ABNORMAL LOW (ref 14.0–49.0)
MCH: 28.2 pg (ref 27.2–33.4)
MONO%: 8.2 % (ref 0.0–14.0)
NEUT#: 11.2 10*3/uL — ABNORMAL HIGH (ref 1.5–6.5)
RBC: 5.32 10*6/uL (ref 4.20–5.82)
RDW: 15.5 % — ABNORMAL HIGH (ref 11.0–14.6)
lymph#: 0.5 10*3/uL — ABNORMAL LOW (ref 0.9–3.3)

## 2012-08-26 LAB — TECHNOLOGIST REVIEW

## 2012-08-26 LAB — COMPREHENSIVE METABOLIC PANEL (CC13)
CO2: 26 mEq/L (ref 22–29)
Creatinine: 1 mg/dL (ref 0.7–1.3)
Glucose: 163 mg/dl — ABNORMAL HIGH (ref 70–99)
Total Bilirubin: 0.4 mg/dL (ref 0.20–1.20)

## 2012-08-26 NOTE — Progress Notes (Signed)
Patient her rad tx 32/33 right frontal brain completed, 1 more tx,, slert, oriented x3, no c/o headaches,blurred vision, or nausea, slight fatigue, eating well, drinking plenty fluids, tongue without thrush, changed decadron today to 4mg  every 8 hours, labs rsults today as well,  No c/o pain  4:34 PM

## 2012-08-26 NOTE — Progress Notes (Signed)
OFFICE PROGRESS NOTE  CC Dr. 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 In June 2013 patient had developed headaches and an MRI showed 5.0 x 5.0 cm mass. He was seen at [facility] by Dr. Hassel Neth and underwent a resection. He subsequently was referred to [facility] and has seen Dr. Margaretmary Bayley for consideration of radiation.  CURRENT THERAPY: Temodar concurrently with radiation.  INTERVAL HISTORY: Brian Hanson 31 y.o. male returns for followup visit. Patient currently is taking Temodar 75 mg per meter squared along with his radiation. His Temodar is daily 7 days a week. He will continue this for the duration. His headaches and arm and leg pain have subsided.  He is doing well.  He will complete radiation tomorrow along with the Temodar.  He complains only of a bitter taste in his throat.  Otherwise, he is well.   MEDICAL HISTORY: Past Medical History  Diagnosis Date  . Brain tumor   . 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 Pneumonia 04/2012  . Seizures 09/2010  . Rhabdomyolysis     s/p hospitalization 09/2010  . History of hemodialysis 09/2010    x 3 s/p renal failure  . GERD (gastroesophageal reflux disease)   . Anxiety   . Astrocytoma brain tumor     ALLERGIES:  is allergic to milk-related compounds and dilantin.  MEDICATIONS:  Current Outpatient Prescriptions  Medication Sig Dispense Refill  . amoxicillin-clavulanate (AUGMENTIN) 875-125 MG per tablet Take 1 tablet by mouth 2 (two) times daily.  20 tablet  0  . dexamethasone (DECADRON) 4 MG tablet Take 1 tablet every 6 hours.  60  tablet  2  . hydrocodone-acetaminophen (LORCET-HD) 5-500 MG per capsule Take 1 capsule by mouth every 6 (six) hours as needed for pain.  30 capsule  0  . levETIRAcetam (KEPPRA) 500 MG tablet Take 500-1,000 mg by mouth 2 (two) times daily. 2 tabs 2 times daily for 10 days, Start date 06/01/12; If no seizures decrease to 1 tab 2 times daily until follow up appt      . LORazepam (ATIVAN) 0.5 MG tablet Take 1 tablet (0.5 mg total) by mouth every 8 (eight) hours.  30 tablet  0  . ondansetron (ZOFRAN) 4 MG tablet Take 4 mg by mouth every 8 (eight) hours as needed.      . pantoprazole (PROTONIX) 40 MG tablet Take 40 mg by mouth daily.      Marland Kitchen temozolomide (TEMODAR) 140 MG capsule Take 1 capsule (140 mg total) by mouth daily. May take on an empty stomach or at bedtime to decrease nausea & vomiting.  40 capsule  0  . temozolomide (TEMODAR) 20 MG capsule Take 1 capsule (20 mg total) by mouth daily. May take on an empty stomach or at bedtime to decrease nausea & vomiting.  40 capsule  2    SURGICAL HISTORY:  Past Surgical History  Procedure Date  . Brain surgery 10/22/2010    [place]  . Craniotomy 05/30/12    Dr Lavonna Monarch, [facility]    REVIEW OF SYSTEMS:   General: fatigue (-), night sweats (-), fever (-), pain (-) Lymph: palpable  nodes (-) HEENT: vision changes (-), mucositis (-), gum bleeding (-), epistaxis (-) Cardiovascular: chest pain (-), palpitations (-) Pulmonary: shortness of breath (-), dyspnea on exertion (-), cough (-), hemoptysis (-) GI:  Early satiety (-), melena (-), dysphagia (-), nausea/vomiting (-), diarrhea (-) GU: dysuria (-), hematuria (-), incontinence (-) Musculoskeletal: joint swelling (-), joint pain (-), back pain (-) Neuro: weakness (-), numbness (-), headache (-), confusion (-) Skin: Rash (-), lesions (-), dryness (-) Psych: depression (-), suicidal/homicidal ideation (-), feeling of hopelessness (-)   PHYSICAL EXAMINATION: There were no vitals taken for this visit.  There is no height or weight on file to calculate BMI. General: Patient is a well appearing male in no acute distress HEENT: PERRLA, sclerae anicteric no conjunctival pallor, MMM Neck: supple, no palpable adenopathy Lungs: clear to auscultation bilaterally, no wheezes, rhonchi, or rales Cardiovascular: regular rate rhythm, S1, S2, no murmurs, rubs or gallops Abdomen: Soft, non-tender, non-distended, normoactive bowel sounds, no HSM Extremities: warm and well perfused, no clubbing, cyanosis, or edema Skin: No rashes or lesions Neuro: Non-focal, CN II-XII intact ECOG PERFORMANCE STATUS: 1 - Symptomatic but completely ambulatory      LABORATORY DATA: Lab Results  Component Value Date   WBC 9.0 08/19/2012   HGB 14.3 08/19/2012   HCT 42.3 08/19/2012   MCV 83.4 08/19/2012   PLT 280 08/19/2012      Chemistry      Component Value Date/Time   NA 140 08/19/2012 1344   NA 135 06/11/2012 1534   K 4.0 08/19/2012 1344   K 4.3 06/11/2012 1534   CL 108* 08/19/2012 1344   CL 96 06/11/2012 1534   CO2 25 08/19/2012 1344   CO2 29 06/11/2012 1534   BUN 15.0 08/19/2012 1344   BUN 18 06/11/2012 1534   CREATININE 0.9 08/19/2012 1344   CREATININE 0.79 06/11/2012 1534      Component Value Date/Time   CALCIUM 9.5 08/19/2012 1344   CALCIUM 9.1 06/11/2012 1534   ALKPHOS 60 08/19/2012 1344   ALKPHOS 76 06/11/2012 1534   AST 10 08/19/2012 1344   AST 18 06/11/2012 1534   ALT 21 08/19/2012 1344   ALT 38 06/11/2012 1534   BILITOT 0.30 08/19/2012 1344   BILITOT 0.3 06/11/2012 1534      RADIOGRAPHIC STUDIES:   ASSESSMENT: 31 year old gentleman with 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 Overall he is doing well tolerating the Temodar quite nicely except for some fatigue.   PLAN:  #1 patient will continue Temodar 75 mg per meter squared on a daily basis until tomorrow.  He will f/u with [facility] on 11/19 and Dr. Welton Flakes on 11/21 to discuss future treatment decisions.    #2 He is  improved on the steroids.  We will decrease the frequency of his Dexamethasone to Q8H.  He understands this.    All questions were answered. The patient knows to call the clinic with any problems, questions or concerns. We can certainly see the patient much sooner if necessary.  I spent 25 minutes counseling the patient face to face. The total time spent in the appointment was 30 minutes.  This case was reviewed with Dr. Welton Flakes.   Cherie Ouch Lyn Hollingshead, NP Medical Oncology [facility] Phone: [phone] 08/26/2012, 1:45 PM

## 2012-08-26 NOTE — Patient Instructions (Addendum)
Doing well.  You will stop your Temodar tomorrow.  We will see you back on 11/21 to determine next steps in your treatment plan.

## 2012-08-26 NOTE — Progress Notes (Signed)
  Radiation Oncology         (336) [phone] ________________________________  Name: Brian Hanson  MRN: [id number]  Date: 08/26/2012  DOB: 1981/06/24  Weekly Radiation Therapy Management  31 year old gentleman status post resection of a grade 3 anaplastic astrocytoma of the right frontal lobe, receiving IMRT with Temodar  Current Dose: 57.6 Gy     Planned Dose:  59.4 Gy  Narrative . . . . . . . . The patient presents for routine under treatment assessment following his penultimate fraction.                                Patient her rad tx 32/33 right frontal brain completed, 1 more tx,, alert, oriented x3, no c/o headaches,blurred vision, or nausea, slight fatigue, eating well, drinking plenty fluids, changed decadron today to 4mg  every 8 hours, labs rsults today as well, No c/o pain .  He is taking decadron 4 mg TID at present.                                 Set-up films were reviewed.                                 The chart was checked.  Physical Findings. . .  weight is 196 lb 12.8 oz (89.268 kg). His oral temperature is 98.3 F (36.8 C). His blood pressure is 117/76 and his pulse is 71. His respiration is 20. . Weight essentially stable.  No significant changes. tongue without thrush,   Impression . . . . . . . The patient is  tolerating radiation.  Plan . . . . . . . . . . . . Continue treatment as planned.  Finish tomorrow with plan for baseline MRI in 6 weeks and review in the multidisciplinary neuro-oncology conference/clinic.  I advised him to cut back to Decadron 4 mg BID in one week, then possible further reduction during follow-up at [facility], if they concur.  ________________________________  Artist Pais. Kathrynn Running, M.D.

## 2012-08-27 ENCOUNTER — Ambulatory Visit
Admission: RE | Admit: 2012-08-27 | Discharge: 2012-08-27 | Disposition: A | Discharge status: Home / Self Care | Payer: PRIVATE HEALTH INSURANCE | Source: Ambulatory Visit | Attending: Radiation Oncology | Admitting: Radiation Oncology

## 2012-08-27 ENCOUNTER — Other Ambulatory Visit: Payer: Self-pay | Admitting: Radiation Therapy

## 2012-08-27 ENCOUNTER — Encounter: Payer: Self-pay | Admitting: Radiation Oncology

## 2012-08-27 DIAGNOSIS — C719 Malignant neoplasm of brain, unspecified: Secondary | ICD-10-CM

## 2012-08-27 NOTE — Progress Notes (Signed)
  Radiation Oncology         (336) [phone] ________________________________  Name: Aram Cada MRN: [id number]  Date: 08/27/2012  DOB: 07-12-1981  End of Treatment Note  Diagnosis:   31 year old gentleman status post resection of a grade 3 anaplastic astrocytoma  Indication for treatment:  Adjuvant, Local Control, Curative  Radiation treatment dates:  07/13/2012-08/27/2012  Site/dose:  The patient's tumor site in the right frontal brain was treated to 59.4 gray in 33 fractions of 1.8 gray  Beams/energy:    the patient was treated using 6 megavolt photons delivered with volumetric arc therapy to give intensity modulated radiotherapy on the TrueBeam unit.  Four arcs were used.   CB-CT was performed for positioning prior to each fraction.  Narrative: The patient tolerated radiation treatment relatively well.   He had hair loss over the treated lesion.  He developed edema in the treated brain and required re-initiation of steroids.  Plan: The patient has completed radiation treatment. The patient will return to radiation oncology clinic for routine followup in one month. I advised them to call or return sooner if they have any questions or concerns related to their recovery or treatment. ________________________________  Artist Pais. Kathrynn Running, M.D.

## 2012-08-30 ENCOUNTER — Encounter: Payer: Self-pay | Admitting: *Deleted

## 2012-08-30 ENCOUNTER — Telehealth: Payer: Self-pay | Admitting: Oncology

## 2012-08-30 ENCOUNTER — Telehealth: Payer: Self-pay | Admitting: *Deleted

## 2012-08-30 NOTE — Telephone Encounter (Signed)
S/w the pt's brother reeza regarding an appt on 07/08/2012 per reeza the pt could not be seen at the appt time given due to another appt at [facility]. Pt's brother requested a later appt time which their was no availability on that same day. S/w mary and the pt could keep the appt that he already had for 09/09/2012. Per reeza the pt will be living in [place] for now. Wanted to cancel the nov appt and r/s for dec 30th to coincide with the rad onc appt for the same day.

## 2012-08-30 NOTE — Telephone Encounter (Signed)
Pt called lmovm to r/s appt 11/21 for 11/19. Per MD pt can be seen at 0900 11/19 with labs prior. Onc tx sent. Called pt unable to reach at this time. Requested scheduler call and notify pt as well.

## 2012-09-02 ENCOUNTER — Ambulatory Visit: Payer: PRIVATE HEALTH INSURANCE | Admitting: Oncology

## 2012-09-02 ENCOUNTER — Other Ambulatory Visit: Payer: PRIVATE HEALTH INSURANCE | Admitting: Lab

## 2012-09-06 NOTE — Progress Notes (Signed)
  Radiation Oncology         [phone]) [phone] ________________________________  Name: Brian Hanson MRN: [id number]  Date: 07/02/2012  DOB: 05-12-1981  SPECIAL TREATMENT PROCEDURE NOTE  NARRATIVE:  The planned course of therapy using radiation constitutes a special treatment procedure. Special care is required in the management of this patient for the following reasons.  I have requested : This treatment constitutes a Special Treatment Procedure for the following reason: [ Concurrent chemotherapy requiring careful monitoring for increased toxicities of treatment including weekly laboratory values..   The special nature of the planned course of radiotherapy will require increased physician supervision and oversight to ensure patient's safety with optimal treatment outcomes. ________________________________  Artist Pais Kathrynn Running, M.D.

## 2012-09-07 ENCOUNTER — Ambulatory Visit (HOSPITAL_BASED_OUTPATIENT_CLINIC_OR_DEPARTMENT_OTHER): Payer: PRIVATE HEALTH INSURANCE | Admitting: Lab

## 2012-09-07 ENCOUNTER — Ambulatory Visit: Payer: PRIVATE HEALTH INSURANCE | Admitting: Oncology

## 2012-09-07 ENCOUNTER — Encounter: Payer: Self-pay | Admitting: Medical Oncology

## 2012-09-07 ENCOUNTER — Telehealth: Payer: Self-pay | Admitting: Medical Oncology

## 2012-09-07 ENCOUNTER — Other Ambulatory Visit: Payer: PRIVATE HEALTH INSURANCE | Admitting: Lab

## 2012-09-07 DIAGNOSIS — C719 Malignant neoplasm of brain, unspecified: Secondary | ICD-10-CM

## 2012-09-07 LAB — COMPREHENSIVE METABOLIC PANEL (CC13)
ALT: 37 U/L (ref 0–55)
BUN: 26 mg/dL (ref 7.0–26.0)
CO2: 25 mEq/L (ref 22–29)
Calcium: 8.8 mg/dL (ref 8.4–10.4)
Chloride: 102 mEq/L (ref 98–107)
Creatinine: 0.9 mg/dL (ref 0.7–1.3)

## 2012-09-07 LAB — CBC WITH DIFFERENTIAL/PLATELET
BASO%: 0.1 % (ref 0.0–2.0)
Basophils Absolute: 0 10*3/uL (ref 0.0–0.1)
Eosinophils Absolute: 0 10*3/uL (ref 0.0–0.5)
HCT: 44.5 % (ref 38.4–49.9)
HGB: 15.4 g/dL (ref 13.0–17.1)
MONO#: 0.5 10*3/uL (ref 0.1–0.9)
NEUT%: 93.2 % — ABNORMAL HIGH (ref 39.0–75.0)
WBC: 11.6 10*3/uL — ABNORMAL HIGH (ref 4.0–10.3)
lymph#: 0.3 10*3/uL — ABNORMAL LOW (ref 0.9–3.3)

## 2012-09-07 NOTE — Telephone Encounter (Signed)
Move a patient from schedule to Lindsey's schedule and accommodate Mr. RUEAVW

## 2012-09-07 NOTE — Telephone Encounter (Signed)
Per MD ok to move pt's appt from 12/30 to 10 am on 12/16 per pt's request. Called pt's brother, Ignacia Felling, and gave new appointment time. Reza confirmed and verbalized understanding of appt time and date. No further questions. Onc-treatment sent.

## 2012-09-07 NOTE — Telephone Encounter (Signed)
Kim from lab with message from pt requesting copy of today's lab result. Call from Darl Pikes in radiation, pt requesting to see MD 10/04/12 instead of 10/18/12 since he has appt with Dr. Kathrynn Running that he has scheduled for this date. Discussed with Darl Pikes, MD does not have opening at this time for 10/04/12. NP schedule has openings. Pt currently scheduled to follow-up 11/29, 12/5, 12/12 with MD. Informed Darl Pikes, MD will be made aware of pt's request and will try to accommodate pt's request. Message forwarded to providers.

## 2012-09-09 ENCOUNTER — Telehealth: Payer: Self-pay | Admitting: *Deleted

## 2012-09-09 ENCOUNTER — Other Ambulatory Visit: Payer: PRIVATE HEALTH INSURANCE | Admitting: Lab

## 2012-09-09 ENCOUNTER — Ambulatory Visit: Payer: PRIVATE HEALTH INSURANCE | Admitting: Oncology

## 2012-09-09 ENCOUNTER — Encounter: Payer: Self-pay | Admitting: *Deleted

## 2012-09-09 NOTE — Telephone Encounter (Signed)
Returned pt's call, LMOVM per pt request to call back at [phone]. Per MD decrease decadron to 4mg  BID until next scheduled f/u with MD . Labs can be mailed or retrieved via mychart.

## 2012-09-14 ENCOUNTER — Telehealth: Payer: Self-pay | Admitting: Emergency Medicine

## 2012-09-14 NOTE — Telephone Encounter (Signed)
Per MD; left message to notify patient that he can decrease his Dexamethasone to 4mg  BID until next follow up with Dr Welton Flakes.   Requested call back to confirm message received.

## 2012-09-17 ENCOUNTER — Other Ambulatory Visit: Payer: PRIVATE HEALTH INSURANCE | Admitting: Lab

## 2012-09-17 ENCOUNTER — Ambulatory Visit: Payer: PRIVATE HEALTH INSURANCE | Admitting: Oncology

## 2012-09-23 ENCOUNTER — Other Ambulatory Visit: Payer: PRIVATE HEALTH INSURANCE | Admitting: Lab

## 2012-09-23 ENCOUNTER — Ambulatory Visit: Payer: PRIVATE HEALTH INSURANCE | Admitting: Oncology

## 2012-09-30 ENCOUNTER — Other Ambulatory Visit: Payer: PRIVATE HEALTH INSURANCE | Admitting: Lab

## 2012-09-30 ENCOUNTER — Encounter: Payer: Self-pay | Admitting: Radiation Oncology

## 2012-09-30 ENCOUNTER — Ambulatory Visit: Payer: PRIVATE HEALTH INSURANCE | Admitting: Oncology

## 2012-09-30 DIAGNOSIS — F419 Anxiety disorder, unspecified: Secondary | ICD-10-CM | POA: Insufficient documentation

## 2012-09-30 DIAGNOSIS — Z923 Personal history of irradiation: Secondary | ICD-10-CM | POA: Insufficient documentation

## 2012-09-30 DIAGNOSIS — D496 Neoplasm of unspecified behavior of brain: Secondary | ICD-10-CM | POA: Insufficient documentation

## 2012-09-30 DIAGNOSIS — K219 Gastro-esophageal reflux disease without esophagitis: Secondary | ICD-10-CM | POA: Insufficient documentation

## 2012-09-30 DIAGNOSIS — M6282 Rhabdomyolysis: Secondary | ICD-10-CM | POA: Insufficient documentation

## 2012-10-01 ENCOUNTER — Other Ambulatory Visit: Payer: PRIVATE HEALTH INSURANCE

## 2012-10-01 ENCOUNTER — Ambulatory Visit (HOSPITAL_COMMUNITY)
Admission: RE | Admit: 2012-10-01 | Discharge: 2012-10-01 | Disposition: A | Discharge status: Home / Self Care | Payer: PRIVATE HEALTH INSURANCE | Source: Ambulatory Visit | Attending: Radiation Oncology | Admitting: Radiation Oncology

## 2012-10-01 DIAGNOSIS — G319 Degenerative disease of nervous system, unspecified: Secondary | ICD-10-CM | POA: Insufficient documentation

## 2012-10-01 DIAGNOSIS — C719 Malignant neoplasm of brain, unspecified: Secondary | ICD-10-CM | POA: Insufficient documentation

## 2012-10-01 DIAGNOSIS — Z09 Encounter for follow-up examination after completed treatment for conditions other than malignant neoplasm: Secondary | ICD-10-CM | POA: Insufficient documentation

## 2012-10-01 MED ORDER — GADOBENATE DIMEGLUMINE 529 MG/ML IV SOLN
18.0000 mL | Freq: Once | INTRAVENOUS | Status: AC | PRN
  Administered 2012-10-01: 18 mL via INTRAVENOUS

## 2012-10-04 ENCOUNTER — Other Ambulatory Visit (HOSPITAL_BASED_OUTPATIENT_CLINIC_OR_DEPARTMENT_OTHER): Payer: PRIVATE HEALTH INSURANCE | Admitting: Lab

## 2012-10-04 ENCOUNTER — Encounter: Payer: Self-pay | Admitting: Radiation Oncology

## 2012-10-04 ENCOUNTER — Telehealth: Payer: Self-pay | Admitting: *Deleted

## 2012-10-04 ENCOUNTER — Ambulatory Visit
Admission: RE | Admit: 2012-10-04 | Discharge: 2012-10-04 | Disposition: A | Discharge status: Home / Self Care | Payer: PRIVATE HEALTH INSURANCE | Source: Ambulatory Visit | Attending: Radiation Oncology | Admitting: Radiation Oncology

## 2012-10-04 ENCOUNTER — Ambulatory Visit (HOSPITAL_BASED_OUTPATIENT_CLINIC_OR_DEPARTMENT_OTHER): Payer: PRIVATE HEALTH INSURANCE | Admitting: Oncology

## 2012-10-04 ENCOUNTER — Other Ambulatory Visit: Payer: Self-pay | Admitting: Radiation Therapy

## 2012-10-04 VITALS — BP 144/103 | HR 106 | Temp 98.3°F | Resp 20 | Ht 69.0 in | Wt 193.7 lb

## 2012-10-04 VITALS — BP 145/107 | HR 105 | Temp 98.8°F | Resp 20 | Wt 195.4 lb

## 2012-10-04 DIAGNOSIS — R51 Headache: Secondary | ICD-10-CM

## 2012-10-04 DIAGNOSIS — G936 Cerebral edema: Secondary | ICD-10-CM

## 2012-10-04 DIAGNOSIS — C719 Malignant neoplasm of brain, unspecified: Secondary | ICD-10-CM

## 2012-10-04 DIAGNOSIS — C711 Malignant neoplasm of frontal lobe: Secondary | ICD-10-CM

## 2012-10-04 LAB — TECHNOLOGIST REVIEW

## 2012-10-04 LAB — CBC WITH DIFFERENTIAL/PLATELET
BASO%: 0.5 % (ref 0.0–2.0)
HCT: 42.4 % (ref 38.4–49.9)
MCHC: 34 g/dL (ref 32.0–36.0)
MONO#: 0.5 10*3/uL (ref 0.1–0.9)
NEUT%: 82.9 % — ABNORMAL HIGH (ref 39.0–75.0)
WBC: 6.5 10*3/uL (ref 4.0–10.3)
lymph#: 0.6 10*3/uL — ABNORMAL LOW (ref 0.9–3.3)

## 2012-10-04 MED ORDER — SULFAMETHOXAZOLE-TRIMETHOPRIM 800-160 MG PO TABS: 1.0000 | ORAL_TABLET | Freq: Two times a day (BID) | ORAL | Status: DC

## 2012-10-04 MED ORDER — ONDANSETRON HCL 8 MG PO TABS
8.0000 mg | ORAL_TABLET | Freq: Three times a day (TID) | ORAL | Status: DC | PRN
Start: 2012-10-04 — End: 2013-02-21

## 2012-10-04 MED ORDER — TEMOZOLOMIDE 100 MG PO CAPS: 200.0000 mg/m2/d | ORAL_CAPSULE | Freq: Every day | ORAL | Status: DC

## 2012-10-04 MED ORDER — TEMOZOLOMIDE 100 MG PO CAPS: 100.0000 mg | ORAL_CAPSULE | Freq: Every day | ORAL | Status: DC

## 2012-10-04 NOTE — Telephone Encounter (Signed)
Gave patient appointment for 02-09-2013 starting at 10:30am

## 2012-10-04 NOTE — Progress Notes (Signed)
Radiation Oncology         [phone] ________________________________  Name: Brian Hanson MRN: [id number]  Date: 10/04/2012  DOB: [birth year]  Multidisciplinary Neuro Oncology Clinic Follow-Up Visit Note  CC: DEFAULT,PROVIDER, MD  Default, Provider, MD  Diagnosis:   31 year old gentleman status post resection of a grade 3 anaplastic astrocytoma in the right frontal brain s/p Adjuvant, Local Control, Curative 07/13/2012-08/27/2012  treated to 59.4 gray in 33 fractions  Interval Since Last Radiation:  1 months  Narrative:  The patient returns today for routine follow-up with myself and neurosurgery.  The recent films were presented in our multidisciplinary conference with neuroradiology just prior to the clinic.  Pt denies HA, pain, nausea, dizziness, loss of appetite, unsteadiness but pt's brother states pt "walks sideways". Pt reports occasional blurry vision in right eye, fatigue. He stopped taking Temodar 08/27/12 per Dr Welton Flakes. Pt taking Decadron 4 mg BID per Dr Welton Flakes beginning 09/24/12. Pt reports 3 days ago he noticed red burning areas in his axillas. These areas are small, scattered, no swelling noted. Pt denies using new deodorant, body wash.                               ALLERGIES:  is allergic to milk-related compounds and dilantin.  Meds: Current Outpatient Prescriptions  Medication Sig Dispense Refill  . dexamethasone (DECADRON) 4 MG tablet Take 4 mg by mouth 3 (three) times daily. Take 1 tablet every 8 hours.      Marland Kitchen dexamethasone (DECADRON) 4 MG tablet Take 4 mg by mouth 3 (three) times daily.      . hydrocodone-acetaminophen (LORCET-HD) 5-500 MG per capsule Take 1 capsule by mouth every 6 (six) hours as needed for pain.  30 capsule  0  . levETIRAcetam (KEPPRA) 500 MG tablet Take 500-1,000 mg by mouth 2 (two) times daily. 2 tabs 2 times daily for 10 days, Start date 06/01/12; If no seizures decrease to 1 tab 2 times daily until follow up appt      . levETIRAcetam (KEPPRA) 500 MG  tablet 500 mg. Take 500 mg by mouth 2 (two) times daily.      . ondansetron (ZOFRAN) 4 MG tablet Take 4 mg by mouth every 8 (eight) hours as needed.      . pantoprazole (PROTONIX) 40 MG tablet Take 40 mg by mouth daily.      . pantoprazole (PROTONIX) 40 MG tablet 40 mg. Take 1 tablet (40 mg total) by mouth daily.      Marland Kitchen LORazepam (ATIVAN) 0.5 MG tablet Take 1 tablet (0.5 mg total) by mouth every 8 (eight) hours.  30 tablet  0  . temozolomide (TEMODAR) 140 MG capsule Take 1 capsule (140 mg total) by mouth daily. May take on an empty stomach or at bedtime to decrease nausea & vomiting.  40 capsule  0  . temozolomide (TEMODAR) 20 MG capsule Take 1 capsule (20 mg total) by mouth daily. May take on an empty stomach or at bedtime to decrease nausea & vomiting.  40 capsule  2    Physical Findings: The patient is in no acute distress. Patient is alert and oriented.  weight is 195 lb 6.4 oz (88.633 kg). His temperature is 98.8 F (37.1 C). His blood pressure is 145/107 and his pulse is 105. His respiration is 20.  Hyperpigmentation in treatment field, Striae in axillae from steroids.  No significant changes.  Lab Findings: Lab Results  Component  Value Date   WBC 11.6* 09/07/2012   HGB 15.4 09/07/2012   HCT 44.5 09/07/2012   MCV 84.4 09/07/2012   PLT 114* 09/07/2012    Radiographic Findings: [name] ZO Contrast  10/01/2012  *RADIOLOGY REPORT*  Clinical Data: Restaging.  7 weeks post completion radiation therapy.  History of anaplastic astrocytoma post resection [facility] 05/21/2012.  No complaints at the present time.  MRI HEAD WITHOUT AND WITH CONTRAST  Technique:  Multiplanar, multiecho pulse sequences of the brain and surrounding structures were obtained according to standard protocol without and with intravenous contrast  Contrast: 18mL MULTIHANCE GADOBENATE DIMEGLUMINE 529 MG/ML IV SOLN  Comparison: 08/13/2012 and 06/30/2012.  Findings: Post resection anterior right frontal lobe.  Large  fluid containing cavity at the resection site with decreased in the thickness of enhancement along the posterior border.  T2 / FLAIR altered signal intensity posterior border of the resection cavity extending along the cingulate gyrus (greater on the right) and extending across the genu of the corpus callosum appears similar on FLAIR imaging however, very subtle change on the right T2 imaging (series 9 images 16, 18, 22 and 24) raises possibility of subtle progressive tumor requiring close surveillance.  Changes related to radiation therapy also a consideration.  Minimal restricted motion along the posterior border of the resection cavity with minimal change since prior exam.  No acute infarct.  No intracranial hemorrhage separate from the resection cavity.  Baseline atrophy.  Slight distortion of the ventricles unchanged.  Major intracranial vascular structures are patent.  Opacification mid right ethmoid sinus air cells.  IMPRESSION: Interval decrease of enhancement along the posterior border of the right frontal lobe resection site.  Altered signal intensity surrounding the resection site stable on FLAIR imaging however, questionable minimal change on T2 imaging. Close surveillance recommended to exclude tumor.  It is possible this is related to radiation therapy changes.   Original Report Authenticated By: Lacy Duverney, M.D.     Impression:  The patient is recovering from the effects of radiation.  He may be able to taper off of steroids now.  Plan:  He will see Dr. Welton Flakes today to discuss possibly going to dexamethasone 2 mg TID and eventually off after BID and QD, if he tolerates that.  _____________________________________  Artist Pais Kathrynn Running, M.D.

## 2012-10-04 NOTE — Patient Instructions (Addendum)
Proceed with temodar 400 mg daily x 5 days and repeat every 28 days  zofran 8 mg  1/2 hour before temodar  Bactrim DS 2 tabs on Monday Wednesday and Friday

## 2012-10-04 NOTE — Progress Notes (Signed)
Pt denies HA, pain, nausea, dizziness, loss of appetite, unsteadiness but pt's brother states pt "walks sideways". Pt reports occasional blurry vision in right eye, fatigue. He stopped taking Temodar 08/27/12 per Dr Welton Flakes. Pt taking Decadron 4 mg BID per Dr Welton Flakes beginning 09/24/12. Pt reports 3 days ago he noticed red burning areas in his axillas. These areas are small, scattered, no swelling noted. Pt denies using new deodorant, body wash.

## 2012-10-06 ENCOUNTER — Emergency Department (HOSPITAL_COMMUNITY): Payer: PRIVATE HEALTH INSURANCE

## 2012-10-06 ENCOUNTER — Encounter (HOSPITAL_COMMUNITY): Payer: Self-pay

## 2012-10-06 ENCOUNTER — Emergency Department (HOSPITAL_COMMUNITY)
Admission: EM | Admit: 2012-10-06 | Discharge: 2012-10-07 | Disposition: A | Discharge status: Home / Self Care | Payer: PRIVATE HEALTH INSURANCE | Attending: Emergency Medicine | Admitting: Emergency Medicine

## 2012-10-06 ENCOUNTER — Other Ambulatory Visit: Payer: Self-pay | Admitting: Oncology

## 2012-10-06 DIAGNOSIS — K219 Gastro-esophageal reflux disease without esophagitis: Secondary | ICD-10-CM | POA: Insufficient documentation

## 2012-10-06 DIAGNOSIS — R739 Hyperglycemia, unspecified: Secondary | ICD-10-CM

## 2012-10-06 DIAGNOSIS — R0602 Shortness of breath: Secondary | ICD-10-CM | POA: Insufficient documentation

## 2012-10-06 DIAGNOSIS — Z992 Dependence on renal dialysis: Secondary | ICD-10-CM | POA: Insufficient documentation

## 2012-10-06 DIAGNOSIS — Z8739 Personal history of other diseases of the musculoskeletal system and connective tissue: Secondary | ICD-10-CM | POA: Insufficient documentation

## 2012-10-06 DIAGNOSIS — Z86011 Personal history of benign neoplasm of the brain: Secondary | ICD-10-CM | POA: Insufficient documentation

## 2012-10-06 DIAGNOSIS — Z8673 Personal history of transient ischemic attack (TIA), and cerebral infarction without residual deficits: Secondary | ICD-10-CM | POA: Insufficient documentation

## 2012-10-06 DIAGNOSIS — R7309 Other abnormal glucose: Secondary | ICD-10-CM | POA: Insufficient documentation

## 2012-10-06 DIAGNOSIS — R5381 Other malaise: Secondary | ICD-10-CM | POA: Insufficient documentation

## 2012-10-06 DIAGNOSIS — Z923 Personal history of irradiation: Secondary | ICD-10-CM | POA: Insufficient documentation

## 2012-10-06 DIAGNOSIS — Z8701 Personal history of pneumonia (recurrent): Secondary | ICD-10-CM | POA: Insufficient documentation

## 2012-10-06 DIAGNOSIS — Z79899 Other long term (current) drug therapy: Secondary | ICD-10-CM | POA: Insufficient documentation

## 2012-10-06 DIAGNOSIS — F411 Generalized anxiety disorder: Secondary | ICD-10-CM | POA: Insufficient documentation

## 2012-10-06 LAB — URINALYSIS, ROUTINE W REFLEX MICROSCOPIC
Glucose, UA: >1000 mg/dL — AB
Leukocytes, UA: NEGATIVE
pH: 5.5 (ref 5.0–8.0)

## 2012-10-06 LAB — COMPREHENSIVE METABOLIC PANEL
Albumin: 3.4 g/dL — ABNORMAL LOW (ref 3.5–5.2)
BUN: 19 mg/dL (ref 6–23)
Creatinine, Ser: 0.68 mg/dL (ref 0.50–1.35)
Total Protein: 6.5 g/dL (ref 6.0–8.3)

## 2012-10-06 LAB — URINE MICROSCOPIC-ADD ON

## 2012-10-06 LAB — CBC
HCT: 43.9 % (ref 39.0–52.0)
MCHC: 35.3 g/dL (ref 30.0–36.0)
MCV: 81.1 fL (ref 78.0–100.0)
RDW: 14.9 % (ref 11.5–15.5)

## 2012-10-06 LAB — GLUCOSE, CAPILLARY

## 2012-10-06 MED ORDER — SODIUM CHLORIDE 0.9 % IV BOLUS (SEPSIS)
1000.0000 mL | Freq: Once | INTRAVENOUS | Status: AC
  Administered 2012-10-06: 1000 mL via INTRAVENOUS

## 2012-10-06 MED ORDER — IOHEXOL 350 MG/ML SOLN
100.0000 mL | Freq: Once | INTRAVENOUS | Status: AC | PRN
  Administered 2012-10-06: 100 mL via INTRAVENOUS

## 2012-10-06 MED ORDER — INSULIN ASPART 100 UNIT/ML ~~LOC~~ SOLN
10.0000 [IU] | Freq: Once | SUBCUTANEOUS | Status: AC
  Administered 2012-10-06: 10 [IU] via SUBCUTANEOUS
  Filled 2012-10-06: qty 1

## 2012-10-06 MED ORDER — INSULIN REGULAR HUMAN 100 UNIT/ML IJ SOLN: 10.0000 [IU] | Freq: Once | INTRAMUSCULAR | Status: DC

## 2012-10-06 NOTE — ED Provider Notes (Signed)
History     CSN: [id number]  Arrival date & time 10/06/12  1929   First MD Initiated Contact with Patient 10/06/12 2207      Chief Complaint  Patient presents with  . Hypertension  . Hyperglycemia   HPI  History provided by the patient. 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 Patient was seen earlier today by his specialists at [facility] and was found to have an elevated blood sugar in the 400s. Patient has been on Decadron for the past month following his last radiation treatment. Patient was told that he would require treatment for this but did not prefer to go to the [facility] emergency room as his home is here in [place]. She was also noted to be tachycardic with complaints of shortness of breath that is office visit and his doctor was concerned for possible PE given his other medical conditions. Patient was instructed to go to the nearest emergency room for further evaluation. Patient arrived here with continued complaints of elevated blood sugar, generalized fatigue and some shortness of breath worse with exertion. He denies any chest pain or pleuritic pain. Denies any hemoptysis. He denies having any swelling of his lower extremities. He denies any recent fever, chills or sweats. No nausea vomiting or diarrhea symptoms.    Past Medical History  Diagnosis Date  . Brain tumor   . 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 Pneumonia 04/2012  . Seizures 09/2010  . Rhabdomyolysis     s/p hospitalization 09/2010  . History of hemodialysis 09/2010    x 3 s/p renal failure  . GERD (gastroesophageal reflux disease)   . Anxiety   . Astrocytoma brain tumor   . Hx of radiation therapy 07/13/12  -08/27/12    right frontal brain    Past Surgical History  Procedure Date  . Brain surgery 10/22/2010    [place]  . Craniotomy 05/30/12    Dr Lavonna Monarch, [facility]    Family History  Problem Relation Age of Onset  . Diabetes Mother   . Hypertension Mother   . Hyperlipidemia Mother   . Diabetes Father   . Hypertension Father     History  Substance Use Topics  . Smoking status: Never Smoker   . Smokeless tobacco: Not on file  . Alcohol Use: No      Review of Systems  Constitutional: Positive for fatigue. Negative for fever, chills, diaphoresis and appetite change.  Respiratory: Positive for shortness of breath. Negative for cough.   Cardiovascular: Negative for chest pain, palpitations and leg swelling.  Gastrointestinal: Negative for nausea, vomiting and diarrhea.  All other systems reviewed and are negative.    Allergies  Milk-related compounds and Dilantin  Home Medications   Current Outpatient Rx  Name  Route  Sig  Dispense  Refill  . CALCIUM PO   Oral   Take by mouth.         . DEXAMETHASONE 4 MG PO TABS   Oral   Take 4 mg by mouth 2 (two) times daily with a meal. Take 1 tablet every 8 hours.         . EMOLLIENT BASE EX CREA   Topical   Apply 1 application topically 3 (three) times daily.  To forehead.         Marland Kitchen LEVETIRACETAM 500 MG PO TABS   Oral   Take 500 mg by mouth 2 (two) times daily.          Marland Kitchen PANTOPRAZOLE SODIUM 40 MG PO TBEC      40 mg. Take 1 tablet (40 mg total) by mouth daily.         . TEMOZOLOMIDE 250 MG PO CAPS   Oral   Take 500 mg by mouth daily. For 5 days. May take on an empty stomach or at bedtime to decrease nausea & vomiting.         Marland Kitchen HYDROCODONE-ACETAMINOPHEN 5-500 MG PO CAPS   Oral   Take 1 capsule by mouth every 6 (six) hours as needed for pain.   30 capsule   0   . LORAZEPAM 0.5 MG PO TABS   Oral   Take 1 tablet (0.5 mg total) by mouth every 8 (eight) hours.   30 tablet   0   . ONDANSETRON HCL 8 MG  PO TABS   Oral   Take 1 tablet (8 mg total) by mouth every 8 (eight) hours as needed for nausea.   20 tablet   5   . SULFAMETHOXAZOLE-TRIMETHOPRIM 800-160 MG PO TABS   Oral   Take 1 tablet by mouth 2 (two) times daily.   60 tablet   6     BP 138/109  Pulse 99  Temp 97.9 F (36.6 C) (Oral)  Resp 20  SpO2 98%  Physical Exam  Nursing note and vitals reviewed. Constitutional: He is oriented to person, place, and time. He appears well-developed and well-nourished.  HENT:  Head: Normocephalic.  Mouth/Throat: Oropharynx is clear and moist.       Well-healed surgical scars over right scalp consistent with history of surgery.  Eyes: EOM are normal. Pupils are equal, round, and reactive to light.  Cardiovascular: Normal rate and regular rhythm.   No murmur heard. Pulmonary/Chest: Effort normal and breath sounds normal. No respiratory distress. He has no wheezes.  Abdominal: Soft. There is no tenderness. There is no rebound and no guarding.  Musculoskeletal: He exhibits no edema and no tenderness.       No clinical signs concerning for DVT  Neurological: He is alert and oriented to person, place, and time.  Skin: Skin is warm.    ED Course  Procedures   Results for orders placed during the hospital encounter of 10/06/12  GLUCOSE, CAPILLARY      Component Value Range   Glucose-Capillary 382 (*) 70 - 99 mg/dL   Comment 1 Documented in Chart     Comment 2 Notify RN    CBC      Component Value Range   WBC 6.4  4.0 - 10.5 K/uL   RBC 5.41  4.22 - 5.81 MIL/uL   Hemoglobin 15.5  13.0 - 17.0 g/dL   HCT 40.9  81.1 - 91.4 %   MCV 81.1  78.0 - 100.0 fL   MCH 28.7  26.0 - 34.0 pg   MCHC 35.3  30.0 - 36.0 g/dL   RDW 78.2  95.6 - 21.3 %   Platelets 128 (*) 150 - 400 K/uL  COMPREHENSIVE METABOLIC PANEL      Component Value Range   Sodium 134 (*) 135 - 145 mEq/L   Potassium 3.7  3.5 - 5.1 mEq/L   Chloride 96  96 - 112 mEq/L   CO2 25  19 -  32 mEq/L   Glucose, Bld 350 (*) 70 - 99  mg/dL   BUN 19  6 - 23 mg/dL   Creatinine, Ser 1.61  0.50 - 1.35 mg/dL   Calcium 9.1  8.4 - 09.6 mg/dL   Total Protein 6.5  6.0 - 8.3 g/dL   Albumin 3.4 (*) 3.5 - 5.2 g/dL   AST 18  0 - 37 U/L   ALT 62 (*) 0 - 53 U/L   Alkaline Phosphatase 130 (*) 39 - 117 U/L   Total Bilirubin 0.6  0.3 - 1.2 mg/dL   GFR calc non Af Amer >90  >90 mL/min   GFR calc Af Amer >90  >90 mL/min  URINALYSIS, ROUTINE W REFLEX MICROSCOPIC      Component Value Range   Color, Urine YELLOW  YELLOW   APPearance CLEAR  CLEAR   Specific Gravity, Urine >1.046 (*) 1.005 - 1.030   pH 5.5  5.0 - 8.0   Glucose, UA >1000 (*) NEGATIVE mg/dL   Hgb urine dipstick NEGATIVE  NEGATIVE   Bilirubin Urine SMALL (*) NEGATIVE   Ketones, ur TRACE (*) NEGATIVE mg/dL   Protein, ur 30 (*) NEGATIVE mg/dL   Urobilinogen, UA 0.2  0.0 - 1.0 mg/dL   Nitrite NEGATIVE  NEGATIVE   Leukocytes, UA NEGATIVE  NEGATIVE  URINE MICROSCOPIC-ADD ON      Component Value Range   Squamous Epithelial / LPF RARE  RARE   WBC, UA 0-2  <3 WBC/hpf   Bacteria, UA RARE  RARE  GLUCOSE, CAPILLARY      Component Value Range   Glucose-Capillary 274 (*) 70 - 99 mg/dL   Comment 1 Notify RN         Ct Angio Chest W/cm &/or Wo Cm  10/06/2012  *RADIOLOGY REPORT*  Clinical Data: Shortness of breath, evaluate for PE  CT ANGIOGRAPHY CHEST  Technique:  Multidetector CT imaging of the chest using the standard protocol during bolus administration of intravenous contrast. Multiplanar reconstructed images including MIPs were obtained and reviewed to evaluate the vascular anatomy.  Contrast: OMNIPAQUE IOHEXOL 350 MG/ML SOLN  Comparison: Chest radiographs dated 08/13/2012  Findings: No evidence of pulmonary embolism.  Lungs are essentially clear.  Mild bilateral lower lobe atelectasis.  No suspicious pulmonary nodules. No pleural effusion or pneumothorax.  Visualized thyroid is unremarkable.  The heart is normal in size.  No pericardial effusion.  No suspicious  mediastinal, hilar, or axillary lymphadenopathy.  Visualized upper abdomen is notable for suspected moderate geographic hepatic steatosis (series 4/image 3).  Visualized osseous structures are within normal limits.  IMPRESSION: No evidence of pulmonary embolism.  No evidence of acute cardiopulmonary disease.  Suspected hepatic steatosis.   Original Report Authenticated By: Charline Bills, M.D.      1. Hyperglycemia   2. SOB (shortness of breath)       MDM  Patient seen and evaluated. Patient currently resting comfortably in bed in no acute distress.  Patient was seen earlier today by his specialists, Dr. Jhonnie Garner at [facility]. Patient brought in paperwork and recommendations from his doctor with him including recommendations for CT and your chest to rule out PE given patient's shortness of breath and tachycardia at the office visit. Patient does report shortness of breath has improved some. There also recommendations for treatment of his elevated blood sugar likely steroid induced.   Blood sugar has been improving after IV fluids in small dose of subcutaneous insulin. Patient continues to be well without significant symptoms. No  signs for DKA or other significant lab abnormalities.   Spoke with Dr. Izola Price with [facility]. She recommendations the patient be put on metformin 500 mg daily for one week with close PCP followup. Patient felt stable for discharge at this time.  Angus Seller, [place] 10/07/12 [id number]

## 2012-10-06 NOTE — ED Notes (Signed)
Patient transported to CT 

## 2012-10-06 NOTE — ED Notes (Signed)
Per pt, seen neuro oncology for follow up.  Had brain surgery in August and had undergone chemo and radiation.  Last tx Nov 8.  Per Neuro, bp and cbg elevated come to hospital.  Pt states he feels weak, fatigued and sleepy with headache.

## 2012-10-06 NOTE — ED Notes (Signed)
PA at bedside.

## 2012-10-07 ENCOUNTER — Telehealth: Payer: Self-pay | Admitting: *Deleted

## 2012-10-07 LAB — GLUCOSE, CAPILLARY: Glucose-Capillary: 274 mg/dL — ABNORMAL HIGH (ref 70–99)

## 2012-10-07 MED ORDER — SODIUM CHLORIDE 0.9 % IV BOLUS (SEPSIS)
1000.0000 mL | Freq: Once | INTRAVENOUS | Status: AC
  Administered 2012-10-07: 1000 mL via INTRAVENOUS

## 2012-10-07 MED ORDER — METFORMIN HCL 500 MG PO TABS: 500.0000 mg | ORAL_TABLET | Freq: Every day | ORAL | Status: DC

## 2012-10-07 NOTE — ED Provider Notes (Signed)
Medical screening examination/treatment/procedure(s) were performed by non-physician practitioner and as supervising physician I was immediately available for consultation/collaboration.  Otilio Groleau R. Syria Kestner, MD 10/07/12 2357 

## 2012-10-07 NOTE — Telephone Encounter (Signed)
Elizabeth from [facility] here concerning pt's Temodar. Called MD for clarification of dosing. Per Dr. Welton Flakes pt to receive 400mg  daily days 1-5 on 28 day cycle. Call to [facility], ( pt will be able to receive free medications) s/w Raynelle Fanning, Pharmacist gave Rx for Temodar 400mg  daily days 1-5. Requested rush order as pt needs medication asap.  Per Raynelle Fanning, able to rush/deliver pt's medications by Saturday if they are able to get in touch with pt. Provided Raynelle Fanning with pt's home and mobile #'s listed in chart.  No further questions. Message forwarded to MD for review.

## 2012-10-08 ENCOUNTER — Telehealth: Payer: Self-pay | Admitting: *Deleted

## 2012-10-08 ENCOUNTER — Other Ambulatory Visit: Payer: Self-pay | Admitting: Oncology

## 2012-10-08 DIAGNOSIS — C719 Malignant neoplasm of brain, unspecified: Secondary | ICD-10-CM

## 2012-10-08 MED ORDER — TEMOZOLOMIDE 100 MG PO CAPS: ORAL_CAPSULE | ORAL | Status: DC

## 2012-10-11 ENCOUNTER — Telehealth: Payer: Self-pay | Admitting: *Deleted

## 2012-10-11 NOTE — Telephone Encounter (Signed)
Pt's brother called regarding medication. Quanity of Rx mailed to pt different form Written RX. Pt received 20 pills. Rx written for 60 pills. Refaxed written Rx to [facility] at [phone].

## 2012-10-15 ENCOUNTER — Other Ambulatory Visit: Payer: PRIVATE HEALTH INSURANCE

## 2012-10-18 ENCOUNTER — Ambulatory Visit: Payer: PRIVATE HEALTH INSURANCE | Admitting: Radiation Oncology

## 2012-10-18 ENCOUNTER — Ambulatory Visit: Payer: PRIVATE HEALTH INSURANCE | Admitting: Oncology

## 2012-10-18 ENCOUNTER — Other Ambulatory Visit: Payer: PRIVATE HEALTH INSURANCE | Admitting: Lab

## 2012-10-24 NOTE — Progress Notes (Signed)
OFFICE PROGRESS NOTE  CC Dr. 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 In June 2013 patient had developed headaches and an MRI showed 5.0 x 5.0 cm mass. He was seen at [facility] by Dr. Hassel Neth and underwent a resection. He subsequently was referred to [place] and has seen Dr. Margaretmary Bayley for consideration of radiation.  #2 patient was recently seen by [facility] and they have recommended he proceed with adjuvant Temodar at 200 mg per meter squared which would be a total dose of 400 mg daily on a daily 1 through 5 basis on a 28 day cycle. He will begin this as soon as he can get his prescriptions filled.  CURRENT THERAPY: adjuvant Temodar at 200 mg per meter squared days one through 5 on every 28 day cycle.  INTERVAL HISTORY: Jobanny Brian 32 y.o. male returns for followup visit.he has not completed his radiation therapy for quite some time about 1-1/2 months. He was seen at [facility] and they did recommend adjuvant Temodar. He is also due to see them later on this week. Overall he feels well. He still does have some weakness in his lower extremities he also complains of being weak or on his left side he does have some headaches as well. He has not had any recent fevers chills or night sweats. Remainder of the 10 point review of systems is negative.  MEDICAL HISTORY: Past Medical History  Diagnosis Date  . Brain tumor   . 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 Pneumonia 04/2012  . Seizures 09/2010  . Rhabdomyolysis     s/p hospitalization 09/2010  . History of hemodialysis 09/2010    x 3 s/p renal  failure  . GERD (gastroesophageal reflux disease)   . Anxiety   . Astrocytoma brain tumor   . Hx of radiation therapy 07/13/12 -08/27/12    right frontal brain    ALLERGIES:  is allergic to milk-related compounds and dilantin.  MEDICATIONS:  Current Outpatient Prescriptions  Medication Sig Dispense Refill  . CALCIUM PO Take by mouth.      . dexamethasone (DECADRON) 4 MG tablet Take 4 mg by mouth 2 (two) times daily with a meal. Take 1 tablet every 8 hours.      Marland Kitchen emollient (BIAFINE) cream Apply 1 application topically 3 (three) times daily. To forehead.      . hydrocodone-acetaminophen (LORCET-HD) 5-500 MG per capsule Take 1 capsule by mouth every 6 (six) hours as needed for pain.  30 capsule  0  . levETIRAcetam (KEPPRA) 500 MG tablet Take 500 mg by mouth 2 (two) times daily.       Marland Kitchen LORazepam (ATIVAN) 0.5 MG tablet Take 1 tablet (0.5 mg total) by mouth every 8 (eight) hours.  30 tablet  0  . metFORMIN (GLUCOPHAGE) 500 MG tablet Take 1 tablet (500 mg total) by mouth daily with breakfast.  14 tablet  0  . ondansetron (ZOFRAN) 8 MG tablet Take 1 tablet (8 mg total) by mouth every 8 (eight) hours as needed for nausea.  20 tablet  5  . pantoprazole (PROTONIX) 40 MG tablet 40 mg. Take 1 tablet (  40 mg total) by mouth daily.      Marland Kitchen sulfamethoxazole-trimethoprim (BACTRIM DS,SEPTRA DS) 800-160 MG per tablet Take 1 tablet by mouth 2 (two) times daily.  60 tablet  6  . temozolomide (TEMODAR) 100 MG capsule Take 1 capsule (100 mg) by mouth daily.May take on an empty stomach or at bedtime to decrease nausea and vomiting  60 capsule  6  . temozolomide (TEMODAR) 250 MG capsule Take 500 mg by mouth daily. For 5 days. May take on an empty stomach or at bedtime to decrease nausea & vomiting.        SURGICAL HISTORY:  Past Surgical History  Procedure Date  . Brain surgery 10/22/2010    [place]  . Craniotomy 05/30/12    Dr Lavonna Monarch, [facility]    REVIEW OF SYSTEMS:   General: fatigue (-), night sweats  (-), fever (-), pain (-) Lymph: palpable nodes (-) HEENT: vision changes (-), mucositis (-), gum bleeding (-), epistaxis (-) Cardiovascular: chest pain (-), palpitations (-) Pulmonary: shortness of breath (-), dyspnea on exertion (-), cough (-), hemoptysis (-) GI:  Early satiety (-), melena (-), dysphagia (-), nausea/vomiting (-), diarrhea (-) GU: dysuria (-), hematuria (-), incontinence (-) Musculoskeletal: joint swelling (-), joint pain (-), back pain (-) Neuro: weakness (-), numbness (-), headache (-), confusion (-) Skin: Rash (-), lesions (-), dryness (-) Psych: depression (-), suicidal/homicidal ideation (-), feeling of hopelessness (-)   PHYSICAL EXAMINATION: Blood pressure 144/103, pulse 106, temperature 98.3 F (36.8 C), temperature source Oral, resp. rate 20, height 5\' 9"  (1.753 m), weight 193 lb 11.2 oz (87.862 kg). Body mass index is 28.60 kg/(m^2). General: Patient is a well appearing male in no acute distress HEENT: PERRLA, sclerae anicteric no conjunctival pallor, MMM Neck: supple, no palpable adenopathy Lungs: clear to auscultation bilaterally, no wheezes, rhonchi, or rales Cardiovascular: regular rate rhythm, S1, S2, no murmurs, rubs or gallops Abdomen: Soft, non-tender, non-distended, normoactive bowel sounds, no HSM Extremities: warm and well perfused, no clubbing, cyanosis, or edema Skin: No rashes or lesions Neuro: Non-focal, CN II-XII intact ECOG PERFORMANCE STATUS: 1 - Symptomatic but completely ambulatory      LABORATORY DATA: Lab Results  Component Value Date   WBC 6.4 10/06/2012   HGB 15.5 10/06/2012   HCT 43.9 10/06/2012   MCV 81.1 10/06/2012   PLT 128* 10/06/2012      Chemistry      Component Value Date/Time   NA 134* 10/06/2012 2204   NA 136 09/07/2012 1319   K 3.7 10/06/2012 2204   K 4.0 09/07/2012 1319   CL 96 10/06/2012 2204   CL 102 09/07/2012 1319   CO2 25 10/06/2012 2204   CO2 25 09/07/2012 1319   BUN 19 10/06/2012 2204   BUN 26.0  09/07/2012 1319   CREATININE 0.68 10/06/2012 2204   CREATININE 0.9 09/07/2012 1319      Component Value Date/Time   CALCIUM 9.1 10/06/2012 2204   CALCIUM 8.8 09/07/2012 1319   ALKPHOS 130* 10/06/2012 2204   ALKPHOS 68 09/07/2012 1319   AST 18 10/06/2012 2204   AST 12 09/07/2012 1319   ALT 62* 10/06/2012 2204   ALT 37 09/07/2012 1319   BILITOT 0.6 10/06/2012 2204   BILITOT 0.58 09/07/2012 1319      RADIOGRAPHIC STUDIES:   ASSESSMENT: 32 year old gentleman with  #1 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  #2 patient had a resection he is now status post concurrent radiation and Temodar.  #3 history of pneumonia  requiring hospitalization.  #4 headaches with vasogenic edema he is on dexamethasone and we will continue this for now.  patient is planning on flying and I have recommended that he continue the Decadron for now until he gets to his destination and then he can start a slow taper.  #5 weakness likely due to Decadron  PLAN:  #1patient will continue Temodar days 1 through 5 on a 28 day cycle. His dose will be 200 mg per meter squared or a total dose of 400 mg.  #2 patient is planning on leaving the [facility] area and I did relay my concerns to them regarding close monitoring of blood counts. Apparently the patient is going back to [place] for  a few months  #3 patient is recommended to start taking Bactrim DS one on a daily basis.this is to prevent opportunistic infections.  #4 off instructed how to handle any emergencies.  #5 they know to call me with any problems.  All questions were answered. The patient knows to call the clinic with any problems, questions or concerns. We can certainly see the patient much sooner if necessary.  I spent 40 minutes counseling the patient face to face. The total time spent in the appointment was 30 minutes.

## 2012-10-29 ENCOUNTER — Telehealth: Payer: Self-pay | Admitting: Emergency Medicine

## 2012-10-29 NOTE — Telephone Encounter (Signed)
Pt's wife called requesting Md's email as he is out of state and has concerns. Discussed with wife Pt will need to log into [facility] and send his concern via email. Email will go to Triage Rn who can bring the concerns to MD/provider. Or pt can call and speak with RN directly. Wife verbalized understanding. No further concerns.

## 2012-11-04 ENCOUNTER — Telehealth: Payer: Self-pay | Admitting: Medical Oncology

## 2012-11-04 NOTE — Telephone Encounter (Signed)
Patients brother LVMOM to report that he sent a fax consisting of labwork./chest xray results/ct scan results. Fax received. Will forward to MD for review.

## 2012-11-04 NOTE — Telephone Encounter (Signed)
ok 

## 2012-12-20 ENCOUNTER — Telehealth: Payer: Self-pay | Admitting: *Deleted

## 2012-12-20 ENCOUNTER — Telehealth: Payer: Self-pay | Admitting: Radiation Oncology

## 2012-12-20 NOTE — Telephone Encounter (Signed)
APPROVED 100%  Family Size: 2  HH INC: 0  MOD POV: 15,730.00 Valid Dates: 12/27/2012-06/29/2013  PLEASE APPLY DISCOUNT TO ANY PRIOR AND ALL CURRENT BILL DOS.

## 2012-12-20 NOTE — Telephone Encounter (Signed)
This RN received call from pt's brother, Ignacia Felling, stating he left a CD with Dr Kathrynn Running and was wanting to get Dr Broadus John opinion.  He also is inquiring if appointment for follow up could be rescheduled from April to [facility].  Return call number given as [phone].  This note will be given to Dr Broadus John RN.

## 2012-12-31 ENCOUNTER — Telehealth: Payer: Self-pay | Admitting: Emergency Medicine

## 2012-12-31 NOTE — Telephone Encounter (Signed)
Left message with new appointment date and time for lab and KK.

## 2013-01-13 ENCOUNTER — Telehealth: Payer: Self-pay | Admitting: Radiation Oncology

## 2013-01-13 NOTE — Telephone Encounter (Signed)
Pt was approved for EPP and wanted me to email his approved letters to [facility] billing, because they do honor [employer].  [place] [employer] Nbr: [id number]  PH: [phone]  I spoke w Waynetta Sandy and emailed two approval letters to:  [email or web address]'

## 2013-01-18 ENCOUNTER — Inpatient Hospital Stay
Admission: RE | Admit: 2013-01-18 | Discharge: 2013-01-18 | Disposition: A | Discharge status: Home / Self Care | Payer: Self-pay | Source: Ambulatory Visit | Attending: Radiation Oncology | Admitting: Radiation Oncology

## 2013-01-18 ENCOUNTER — Other Ambulatory Visit: Payer: Self-pay | Admitting: Radiation Oncology

## 2013-01-18 DIAGNOSIS — G9389 Other specified disorders of brain: Secondary | ICD-10-CM

## 2013-02-02 NOTE — Telephone Encounter (Signed)
Erroneous

## 2013-02-04 ENCOUNTER — Other Ambulatory Visit (HOSPITAL_COMMUNITY): Payer: PRIVATE HEALTH INSURANCE

## 2013-02-09 ENCOUNTER — Ambulatory Visit: Payer: PRIVATE HEALTH INSURANCE | Admitting: Oncology

## 2013-02-09 ENCOUNTER — Other Ambulatory Visit: Payer: PRIVATE HEALTH INSURANCE | Admitting: Lab

## 2013-02-18 ENCOUNTER — Ambulatory Visit (HOSPITAL_COMMUNITY)
Admission: RE | Admit: 2013-02-18 | Discharge: 2013-02-18 | Disposition: A | Discharge status: Home / Self Care | Payer: No Typology Code available for payment source | Source: Ambulatory Visit | Attending: Radiation Oncology | Admitting: Radiation Oncology

## 2013-02-18 ENCOUNTER — Other Ambulatory Visit (HOSPITAL_COMMUNITY): Payer: PRIVATE HEALTH INSURANCE

## 2013-02-18 DIAGNOSIS — Z923 Personal history of irradiation: Secondary | ICD-10-CM | POA: Insufficient documentation

## 2013-02-18 DIAGNOSIS — C719 Malignant neoplasm of brain, unspecified: Secondary | ICD-10-CM

## 2013-02-18 MED ORDER — GADOBENATE DIMEGLUMINE 529 MG/ML IV SOLN
18.0000 mL | Freq: Once | INTRAVENOUS | Status: AC | PRN
  Administered 2013-02-18: 18 mL via INTRAVENOUS

## 2013-02-20 ENCOUNTER — Encounter: Payer: Self-pay | Admitting: Radiation Oncology

## 2013-02-20 NOTE — Progress Notes (Signed)
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  The recent films were presented in our multidisciplinary conference with neuroradiology just prior to the clinic.  The patient remains active. His only complaint is some left sided occipital headache up once weekly.                              ALLERGIES:  is allergic to milk-related compounds and dilantin.  Meds: Current Outpatient Prescriptions  Medication Sig Dispense Refill  . CALCIUM PO Take by mouth.      . levETIRAcetam (KEPPRA) 500 MG tablet Take 500 mg by mouth 2 (two) times daily.       . montelukast (SINGULAIR) 10 MG tablet Take 10 mg by mouth at bedtime.      Marland Kitchen dexamethasone (DECADRON) 4 MG tablet Take 4 mg by mouth 2 (two) times daily with a meal. Take 1 tablet every 8 hours.      Marland Kitchen emollient (BIAFINE) cream Apply 1 application topically 3 (three) times daily. To forehead.      . hydrocodone-acetaminophen (LORCET-HD) 5-500 MG per capsule Take 1 capsule by mouth every 6 (six) hours as needed for pain.  30 capsule  0  . LORazepam (ATIVAN) 0.5 MG tablet Take 1 tablet (0.5 mg total) by mouth every 8 (eight) hours.  30 tablet  0  . metFORMIN (GLUCOPHAGE) 500 MG tablet Take 1 tablet (500 mg total) by mouth daily with breakfast.  14 tablet  0  . ondansetron (ZOFRAN) 8 MG tablet Take 1 tablet (8 mg total) by mouth every 8 (eight) hours as needed for nausea.  60 tablet  7  . pantoprazole  (PROTONIX) 40 MG tablet 40 mg. Take 1 tablet (40 mg total) by mouth daily.      Marland Kitchen sulfamethoxazole-trimethoprim (BACTRIM DS,SEPTRA DS) 800-160 MG per tablet Take 1 tablet by mouth 2 (two) times daily.  60 tablet  6  . temozolomide (TEMODAR) 100 MG capsule Take 1 capsule (100 mg) by mouth daily.May take on an empty stomach or at bedtime to decrease nausea and vomiting  60 capsule  7  . temozolomide (TEMODAR) 250 MG capsule Take 2 capsules (500 mg total) by mouth daily. For 5 days. May take on an empty stomach or at bedtime to decrease nausea & vomiting.  60 capsule  7   No current facility-administered medications for this encounter.    Physical Findings: The patient is in no acute distress. Patient is alert and oriented.  weight is 185 lb 9.6 oz (84.188 kg). His oral temperature is 97 F (36.1 C). His blood pressure is 123/88 and his pulse is 84. His respiration is 16 and oxygen saturation is 100%. .  No significant changes.  Lab Findings: Lab Results  Component Value Date   WBC 3.9* 02/21/2013   HGB 14.1 02/21/2013   HCT 41.7 02/21/2013   MCV 82.4 02/21/2013   PLT 170 02/21/2013    @LASTCHEM @  Radiographic Findings: [name] ZO Contrast  02/18/2013  *RADIOLOGY REPORT*  Clinical Data: Restaging astrocytoma post surgery and radiation therapy.  MRI HEAD WITHOUT AND WITH CONTRAST  Technique:  Multiplanar, multiecho pulse sequences of the brain and surrounding structures were obtained according to standard protocol without and with intravenous contrast  Contrast: 18mL MULTIHANCE GADOBENATE DIMEGLUMINE 529 MG/ML IV SOLN  Comparison: Several prior exams, most recent is an outside examination performed 11/27/2012.  Findings: Post resection anterior right frontal lobe.  Surrounding the resection cavity is peripheral enhancement and T2 altered signal intensity along the posterior margin.  T2 altered signal intensity extends along the genu of the corpus callosum and in a left frontal horn periventricular  position.  Overall appearance is similar to the prior exam.  No acute infarct.  No intracranial hemorrhage.  Major intracranial vascular structures are patent.  Interval development of moderate polypoid opacification right maxillary sinus.  Minimal mucosal thickening ethmoid sinus air cells and frontal sinuses.  IMPRESSION: Post resection anterior right frontal lobe.  Relatively similar appearance of post-therapy changes when compared to the 11/27/2012 outside MR as noted above.  Interval development of moderate mucosal thickening right maxillary sinus.   Original Report Authenticated By: Lacy Duverney, M.D.    Impression:  The patient is maintains an excellent performance status.  His current MRI shows no evidence of active recurrence the brain. The cause of his occasional left occipital headaches is not clear. He does have some increasing right maxillary sinusitis.  Plan:  Followup at [facility] tomorrow and followup MRI as planned. The patient will see me again in 4 months  _____________________________________  Artist Pais. Kathrynn Running, M.D.

## 2013-02-21 ENCOUNTER — Ambulatory Visit (HOSPITAL_BASED_OUTPATIENT_CLINIC_OR_DEPARTMENT_OTHER): Payer: PRIVATE HEALTH INSURANCE | Admitting: Oncology

## 2013-02-21 ENCOUNTER — Ambulatory Visit
Admission: RE | Admit: 2013-02-21 | Discharge: 2013-02-21 | Disposition: A | Discharge status: Home / Self Care | Payer: PRIVATE HEALTH INSURANCE | Source: Ambulatory Visit | Attending: Radiation Oncology | Admitting: Radiation Oncology

## 2013-02-21 ENCOUNTER — Encounter: Payer: Self-pay | Admitting: Radiation Oncology

## 2013-02-21 ENCOUNTER — Telehealth: Payer: Self-pay | Admitting: Medical Oncology

## 2013-02-21 ENCOUNTER — Other Ambulatory Visit (HOSPITAL_BASED_OUTPATIENT_CLINIC_OR_DEPARTMENT_OTHER): Payer: PRIVATE HEALTH INSURANCE

## 2013-02-21 VITALS — BP 115/84 | HR 82 | Temp 98.4°F

## 2013-02-21 VITALS — BP 123/88 | HR 84 | Temp 97.0°F | Resp 16 | Wt 185.6 lb

## 2013-02-21 DIAGNOSIS — C719 Malignant neoplasm of brain, unspecified: Secondary | ICD-10-CM

## 2013-02-21 DIAGNOSIS — C711 Malignant neoplasm of frontal lobe: Secondary | ICD-10-CM

## 2013-02-21 DIAGNOSIS — R51 Headache: Secondary | ICD-10-CM

## 2013-02-21 LAB — CBC WITH DIFFERENTIAL/PLATELET
BASO%: 0.4 % (ref 0.0–2.0)
Basophils Absolute: 0 10e3/uL (ref 0.0–0.1)
EOS%: 0.8 % (ref 0.0–7.0)
Eosinophils Absolute: 0 10e3/uL (ref 0.0–0.5)
HCT: 41.7 % (ref 38.4–49.9)
HGB: 14.1 g/dL (ref 13.0–17.1)
LYMPH%: 22.4 % (ref 14.0–49.0)
MCH: 27.9 pg (ref 27.2–33.4)
MCHC: 33.9 g/dL (ref 32.0–36.0)
MCV: 82.4 fL (ref 79.3–98.0)
MONO#: 0.4 10e3/uL (ref 0.1–0.9)
MONO%: 9.7 % (ref 0.0–14.0)
NEUT#: 2.6 10e3/uL (ref 1.5–6.5)
NEUT%: 66.7 % (ref 39.0–75.0)
Platelets: 170 10e3/uL (ref 140–400)
RBC: 5.06 10e6/uL (ref 4.20–5.82)
RDW: 14.1 % (ref 11.0–14.6)
WBC: 3.9 10e3/uL — ABNORMAL LOW (ref 4.0–10.3)
lymph#: 0.9 10e3/uL (ref 0.9–3.3)

## 2013-02-21 LAB — COMPREHENSIVE METABOLIC PANEL (CC13)
ALT: 10 U/L (ref 0–55)
AST: 12 U/L (ref 5–34)
Albumin: 3.5 g/dL (ref 3.5–5.0)
Calcium: 9.3 mg/dL (ref 8.4–10.4)
Chloride: 106 mEq/L (ref 98–107)
Potassium: 3.7 mEq/L (ref 3.5–5.1)
Sodium: 141 mEq/L (ref 136–145)

## 2013-02-21 LAB — POCT I-STAT, CHEM 8
Calcium, Ion: 1.15 mmol/L (ref 1.12–1.23)
Chloride: 106 mEq/L (ref 96–112)
Glucose, Bld: 104 mg/dL — ABNORMAL HIGH (ref 70–99)
HCT: 42 % (ref 39.0–52.0)
Hemoglobin: 14.3 g/dL (ref 13.0–17.0)

## 2013-02-21 MED ORDER — TEMOZOLOMIDE 250 MG PO CAPS: 500.0000 mg | ORAL_CAPSULE | Freq: Every day | ORAL | Status: DC

## 2013-02-21 MED ORDER — TEMOZOLOMIDE 100 MG PO CAPS: ORAL_CAPSULE | ORAL | Status: DC

## 2013-02-21 MED ORDER — ONDANSETRON HCL 8 MG PO TABS: 8.0000 mg | ORAL_TABLET | Freq: Three times a day (TID) | ORAL | Status: DC | PRN

## 2013-02-21 NOTE — Patient Instructions (Addendum)
Continue temodar days 1 - 5 every month  Take bactrim DS MWF  I will see you back in [place]

## 2013-02-21 NOTE — Telephone Encounter (Signed)
Per MD, prescription for temodar given to Brian Hanson, in managed care, for preauthorization.

## 2013-02-21 NOTE — Progress Notes (Signed)
OFFICE PROGRESS NOTE  CC Dr. 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 In June 2013 patient had developed headaches and an MRI showed 5.0 x 5.0 cm mass. He was seen at [facility] by Dr. Hassel Neth and underwent a resection. He subsequently was referred to [facility] and has seen Dr. Margaretmary Bayley for consideration of radiation.  #2 patient was recently seen by [facility] and they have recommended he proceed with adjuvant Temodar at 200 mg per meter squared which would be a total dose of 400 mg daily on a daily 1 through 5 basis on a 28 day cycle. He will begin this as soon as he can get his prescriptions filled.  CURRENT THERAPY: adjuvant Temodar at 200 mg per meter squared days one through 5 on every 28 day cycle.  INTERVAL HISTORY: Brian Hanson 32 y.o. male returns for followup visit patient returns in followup. He returned from [place] a few weeks ago. Overall he did well except for one hospitalization for pneumonia. He continued to take the adjuvant Temodar without any problems. He today is feeling well he is a little tired occasionally has left hip pain and headaches. He occasionally also has nausea but he does take medications for it. He does have ondansetron. Patient discontinued the Bactrim DS. I have again reiterated him to start taking that. Remainder of the 10 point review of systems is negative.  MEDICAL HISTORY: Past Medical History  Diagnosis Date  . Brain tumor   . 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 Pneumonia 04/2012  . Seizures 09/2010  . Rhabdomyolysis     s/p hospitalization 09/2010   . History of hemodialysis 09/2010    x 3 s/p renal failure  . GERD (gastroesophageal reflux disease)   . Anxiety   . Astrocytoma brain tumor   . Hx of radiation therapy 07/13/12 -08/27/12    right frontal brain    ALLERGIES:  is allergic to milk-related compounds and dilantin.  MEDICATIONS:  Current Outpatient Prescriptions  Medication Sig Dispense Refill  . CALCIUM PO Take by mouth.      . dexamethasone (DECADRON) 4 MG tablet Take 4 mg by mouth 2 (two) times daily with a meal. Take 1 tablet every 8 hours.      Marland Kitchen emollient (BIAFINE) cream Apply 1 application topically 3 (three) times daily. To forehead.      . hydrocodone-acetaminophen (LORCET-HD) 5-500 MG per capsule Take 1 capsule by mouth every 6 (six) hours as needed for pain.  30 capsule  0  . levETIRAcetam (KEPPRA) 500 MG tablet Take 500 mg by mouth 2 (two) times daily.       Marland Kitchen LORazepam (ATIVAN) 0.5 MG tablet Take 1 tablet (0.5 mg total) by mouth every 8 (eight) hours.  30 tablet  0  . metFORMIN (GLUCOPHAGE) 500 MG tablet Take 1 tablet (500 mg total) by mouth daily with breakfast.  14 tablet  0  . montelukast (SINGULAIR) 10 MG tablet Take 10 mg by mouth at bedtime.      . ondansetron (ZOFRAN) 8 MG tablet Take 1 tablet (8 mg total) by mouth every 8 (eight) hours as needed  for nausea.  60 tablet  7  . pantoprazole (PROTONIX) 40 MG tablet 40 mg. Take 1 tablet (40 mg total) by mouth daily.      Marland Kitchen sulfamethoxazole-trimethoprim (BACTRIM DS,SEPTRA DS) 800-160 MG per tablet Take 1 tablet by mouth 2 (two) times daily.  60 tablet  6  . temozolomide (TEMODAR) 100 MG capsule Take 1 capsule (100 mg) by mouth daily.May take on an empty stomach or at bedtime to decrease nausea and vomiting  60 capsule  7   No current facility-administered medications for this visit.    SURGICAL HISTORY:  Past Surgical History  Procedure Laterality Date  . Brain surgery  10/22/2010    [place]  . Craniotomy  05/30/12    Dr Lavonna Monarch, [facility]    REVIEW  OF SYSTEMS:   General: fatigue (-), night sweats (-), fever (-), pain (-) Lymph: palpable nodes (-) HEENT: vision changes (-), mucositis (-), gum bleeding (-), epistaxis (-) Cardiovascular: chest pain (-), palpitations (-) Pulmonary: shortness of breath (-), dyspnea on exertion (-), cough (-), hemoptysis (-) GI:  Early satiety (-), melena (-), dysphagia (-), nausea/vomiting (-), diarrhea (-) GU: dysuria (-), hematuria (-), incontinence (-) Musculoskeletal: joint swelling (-), joint pain (-), back pain (-) Neuro: weakness (-), numbness (-), headache (-), confusion (-) Skin: Rash (-), lesions (-), dryness (-) Psych: depression (-), suicidal/homicidal ideation (-), feeling of hopelessness (-)   PHYSICAL EXAMINATION: Blood pressure 115/84, pulse 82, temperature 98.4 F (36.9 C), temperature source Oral. There is no weight on file to calculate BMI. General: Patient is a well appearing male in no acute distress HEENT: PERRLA, sclerae anicteric no conjunctival pallor, MMM Neck: supple, no palpable adenopathy Lungs: clear to auscultation bilaterally, no wheezes, rhonchi, or rales Cardiovascular: regular rate rhythm, S1, S2, no murmurs, rubs or gallops Abdomen: Soft, non-tender, non-distended, normoactive bowel sounds, no HSM Extremities: warm and well perfused, no clubbing, cyanosis, or edema Skin: No rashes or lesions Neuro: Non-focal, CN II-XII intact ECOG PERFORMANCE STATUS: 1 - Symptomatic but completely ambulatory      LABORATORY DATA: Lab Results  Component Value Date   WBC 3.9* 02/21/2013   HGB 14.1 02/21/2013   HCT 41.7 02/21/2013   MCV 82.4 02/21/2013   PLT 170 02/21/2013      Chemistry      Component Value Date/Time   NA 141 02/21/2013 1126   NA 140 02/18/2013 1916   K 3.7 02/21/2013 1126   K 3.6 02/18/2013 1916   CL 106 02/21/2013 1126   CL 106 02/18/2013 1916   CO2 26 02/21/2013 1126   CO2 25 10/06/2012 2204   BUN 13.4 02/21/2013 1126   BUN 12 02/18/2013 1916   CREATININE 0.9 02/21/2013  1126   CREATININE 0.90 02/18/2013 1916      Component Value Date/Time   CALCIUM 9.3 02/21/2013 1126   CALCIUM 9.1 10/06/2012 2204   ALKPHOS 82 02/21/2013 1126   ALKPHOS 130* 10/06/2012 2204   AST 12 02/21/2013 1126   AST 18 10/06/2012 2204   ALT 10 02/21/2013 1126   ALT 62* 10/06/2012 2204   BILITOT 0.54 02/21/2013 1126   BILITOT 0.6 10/06/2012 2204      RADIOGRAPHIC STUDIES:   ASSESSMENT: 32 year old gentleman with  #1 grade 3 anaplastic astrocytoma status post resection at [facility]. Patient underwent resection followed by concurrent radiation and Temodar. He then started Temodar adjuvantly and 200 mg per meter squared for a total of 400 mg given days 1 through 5 on a 28 day  cycle. He was given all of his medications and he took most of his adjuvant therapy in [place]. Thus far he has taken 4 cycles. He is to begin cycle 5 on 02/27/2013 through 03/03/2013.  #2 patient has history of recurrent pneumonia as he was recently hospitalized in [place].  #3 PCP prophylaxis patient was begun on Bactrim DS Monday Wednesday Friday he was recommended to continue this.  #4 headaches off-and-on there controlled with analgesic    PLAN:  #1patient will continue Temodar days 1 through 5 on a 28 day cycle. His dose will be 200 mg per meter squared or a total dose of 400 mg.  #2 patient is planning on leaving the [facility] area and I did relay my concerns to them regarding close monitoring of blood counts. Apparently the patient is going back to [place] for  a few months. He will return in September 2014.  #3 patient is recommended to start taking Bactrim DS Monday Wednesday Friday.  #4 off instructed how to handle any emergencies.  #5 they know to call me with any problems.  All questions were answered. The patient knows to call the clinic with any problems, questions or concerns. We can certainly see the patient much sooner if necessary.  I spent 40 minutes counseling the patient face to face.  The total time spent in the appointment was 30 minutes.

## 2013-02-21 NOTE — Progress Notes (Signed)
Patient presents to the clinic today accompanied by his brother for follow up with Dr. Kathrynn Running. Patient alert and oriented to person, place and time. No distress noted. Steady gait noted. Pleasant affect noted. Patient denies pain at this time. However, patient does reports that he feels right frontal pain that he is unable to describe or score. Patient reports that often he feels a dull headache in the left occipital lobe 3-4 on a scale of 0-10 for which he takes no medication. Also, patient reports intermittent left left pain 3-4 on a scale of 0-10 for which he takes no medication and reports is worse at night. Patient reports nausea and vomiting associated with temodar. Patient reports taking 5 days of temodar then off for 28. Patient reports decreased appetite and weight loss of 10 pounds since January. Patient denies diplopia, dizziness or ringing in the ears. Reported all findings to Dr. Kathrynn Running.

## 2013-02-22 ENCOUNTER — Telehealth: Payer: Self-pay | Admitting: Oncology

## 2013-02-22 NOTE — Telephone Encounter (Signed)
, °

## 2013-03-15 ENCOUNTER — Telehealth: Payer: Self-pay | Admitting: Oncology

## 2013-03-15 ENCOUNTER — Other Ambulatory Visit: Payer: Self-pay | Admitting: Radiation Therapy

## 2013-03-15 DIAGNOSIS — C719 Malignant neoplasm of brain, unspecified: Secondary | ICD-10-CM

## 2013-04-04 ENCOUNTER — Ambulatory Visit: Payer: PRIVATE HEALTH INSURANCE | Admitting: Radiation Oncology

## 2013-06-22 ENCOUNTER — Other Ambulatory Visit: Payer: PRIVATE HEALTH INSURANCE | Admitting: Lab

## 2013-06-22 ENCOUNTER — Ambulatory Visit: Payer: PRIVATE HEALTH INSURANCE | Admitting: Oncology

## 2013-06-24 ENCOUNTER — Other Ambulatory Visit (HOSPITAL_COMMUNITY): Payer: PRIVATE HEALTH INSURANCE

## 2013-06-27 ENCOUNTER — Ambulatory Visit: Payer: PRIVATE HEALTH INSURANCE | Admitting: Oncology

## 2013-06-27 ENCOUNTER — Other Ambulatory Visit: Payer: PRIVATE HEALTH INSURANCE | Admitting: Lab

## 2013-06-27 ENCOUNTER — Ambulatory Visit: Payer: PRIVATE HEALTH INSURANCE | Admitting: Radiation Oncology

## 2013-07-12 ENCOUNTER — Telehealth: Payer: Self-pay | Admitting: Hematology & Oncology

## 2013-07-12 NOTE — Telephone Encounter (Signed)
Pt's bro [facility]) called to advise his FA expired on 06/29/2013 and wants a CAFA application mailed today.

## 2013-08-08 ENCOUNTER — Telehealth: Payer: Self-pay | Admitting: Oncology

## 2013-08-08 NOTE — Telephone Encounter (Signed)
, °

## 2013-10-26 ENCOUNTER — Telehealth: Payer: Self-pay | Admitting: Hematology & Oncology

## 2013-10-26 NOTE — Telephone Encounter (Signed)
Pt's brother [facility]) in ofc to reapply for FA. Application complete w letter of support and will be submitted to the [facility] ofc for completion/approval.  Pt has no insurance, no income, no bank and not a [place] citizen. Orig FA expired on 12/26/2012  Please contact pt's brother for addl information @ [phone]

## 2013-10-31 ENCOUNTER — Ambulatory Visit: Payer: PRIVATE HEALTH INSURANCE | Admitting: Oncology

## 2013-10-31 ENCOUNTER — Other Ambulatory Visit: Payer: PRIVATE HEALTH INSURANCE | Admitting: Lab

## 2013-10-31 ENCOUNTER — Ambulatory Visit: Payer: PRIVATE HEALTH INSURANCE | Admitting: Radiation Oncology

## 2013-11-04 ENCOUNTER — Inpatient Hospital Stay
Admission: RE | Admit: 2013-11-04 | Discharge: 2013-11-04 | Disposition: A | Discharge status: Home / Self Care | Payer: Self-pay | Source: Ambulatory Visit | Attending: Radiation Oncology | Admitting: Radiation Oncology

## 2013-11-04 ENCOUNTER — Telehealth: Payer: Self-pay | Admitting: Oncology

## 2013-11-04 ENCOUNTER — Other Ambulatory Visit: Payer: Self-pay

## 2013-11-04 ENCOUNTER — Inpatient Hospital Stay: Admission: RE | Admit: 2013-11-04 | Payer: Self-pay | Source: Ambulatory Visit

## 2013-11-04 ENCOUNTER — Other Ambulatory Visit: Payer: Self-pay | Admitting: Radiation Oncology

## 2013-11-04 DIAGNOSIS — C719 Malignant neoplasm of brain, unspecified: Secondary | ICD-10-CM

## 2013-11-07 ENCOUNTER — Ambulatory Visit: Admission: RE | Admit: 2013-11-07 | Payer: PRIVATE HEALTH INSURANCE | Source: Ambulatory Visit

## 2013-11-07 ENCOUNTER — Other Ambulatory Visit: Payer: Self-pay | Admitting: Radiation Oncology

## 2013-11-07 ENCOUNTER — Ambulatory Visit
Admission: RE | Admit: 2013-11-07 | Discharge: 2013-11-07 | Disposition: A | Discharge status: Home / Self Care | Payer: Self-pay | Source: Ambulatory Visit | Attending: Radiation Oncology | Admitting: Radiation Oncology

## 2013-11-07 ENCOUNTER — Other Ambulatory Visit (HOSPITAL_BASED_OUTPATIENT_CLINIC_OR_DEPARTMENT_OTHER): Payer: PRIVATE HEALTH INSURANCE

## 2013-11-07 DIAGNOSIS — C719 Malignant neoplasm of brain, unspecified: Secondary | ICD-10-CM

## 2013-11-07 DIAGNOSIS — C711 Malignant neoplasm of frontal lobe: Secondary | ICD-10-CM

## 2013-11-07 LAB — CBC WITH DIFFERENTIAL/PLATELET
BASO%: 0.7 % (ref 0.0–2.0)
BASOS ABS: 0 10*3/uL (ref 0.0–0.1)
EOS ABS: 0.1 10*3/uL (ref 0.0–0.5)
EOS%: 1 % (ref 0.0–7.0)
HEMATOCRIT: 48.1 % (ref 38.4–49.9)
HEMOGLOBIN: 16.3 g/dL (ref 13.0–17.1)
LYMPH#: 1.5 10*3/uL (ref 0.9–3.3)
LYMPH%: 25.9 % (ref 14.0–49.0)
MCH: 30 pg (ref 27.2–33.4)
MCHC: 33.8 g/dL (ref 32.0–36.0)
MCV: 88.8 fL (ref 79.3–98.0)
MONO#: 0.5 10*3/uL (ref 0.1–0.9)
MONO%: 8.9 % (ref 0.0–14.0)
NEUT#: 3.7 10*3/uL (ref 1.5–6.5)
NEUT%: 63.5 % (ref 39.0–75.0)
Platelets: 204 10*3/uL (ref 140–400)
RBC: 5.41 10*6/uL (ref 4.20–5.82)
RDW: 14.6 % (ref 11.0–14.6)
WBC: 5.8 10*3/uL (ref 4.0–10.3)

## 2013-11-07 LAB — COMPREHENSIVE METABOLIC PANEL (CC13)
ALT: 18 U/L (ref 0–55)
ANION GAP: 10 meq/L (ref 3–11)
AST: 17 U/L (ref 5–34)
Albumin: 4.1 g/dL (ref 3.5–5.0)
Alkaline Phosphatase: 99 U/L (ref 40–150)
BUN: 12.5 mg/dL (ref 7.0–26.0)
CO2: 26 mEq/L (ref 22–29)
CREATININE: 0.9 mg/dL (ref 0.7–1.3)
Calcium: 9.3 mg/dL (ref 8.4–10.4)
Chloride: 104 mEq/L (ref 98–109)
Glucose: 100 mg/dl (ref 70–140)
Potassium: 3.8 mEq/L (ref 3.5–5.1)
Sodium: 140 mEq/L (ref 136–145)
Total Bilirubin: 0.49 mg/dL (ref 0.20–1.20)
Total Protein: 7.8 g/dL (ref 6.4–8.3)

## 2013-11-08 ENCOUNTER — Encounter: Payer: Self-pay | Admitting: Radiation Oncology

## 2013-11-08 NOTE — Progress Notes (Signed)
Radiation Oncology         [phone] ________________________________  Name: Brian Hanson MRN: [id number]  Date: 11/09/2013  DOB: 03/21/1981  Multidisciplinary Neuro Oncology Clinic Follow-Up Visit Note  CC: Default, Provider, MD  No ref. 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  The recent films were presented in our multidisciplinary conference with neuroradiology earlier this week.  He has no complaints.                              ALLERGIES:  is allergic to milk-related compounds and dilantin.  Meds: Current Outpatient Prescriptions  Medication Sig Dispense Refill  . CALCIUM PO Take by mouth.      . levETIRAcetam (KEPPRA) 500 MG tablet Take 500 mg by mouth 2 (two) times daily.       . metFORMIN (GLUCOPHAGE) 500 MG tablet Take 1 tablet (500 mg total) by mouth daily with breakfast.  14 tablet  0  . sulfamethoxazole-trimethoprim (BACTRIM DS,SEPTRA DS) 800-160 MG per tablet Take 1 tablet by mouth 2 (two) times daily.  60 tablet  6  . dexamethasone (DECADRON) 4 MG tablet Take 4 mg by mouth 2 (two) times daily with a meal. Take 1 tablet every 8 hours.      Marland Kitchen emollient (BIAFINE) cream Apply 1 application topically 3 (three) times daily. To forehead.      . hydrocodone-acetaminophen (LORCET-HD) 5-500 MG per capsule Take 1 capsule by mouth every 6 (six) hours as needed for pain.  30 capsule  0  . LORazepam (ATIVAN) 0.5 MG tablet Take 1 tablet (0.5 mg total) by mouth every 8 (eight) hours.  30 tablet  0  . montelukast (SINGULAIR) 10 MG tablet Take 10 mg by mouth at bedtime.      . ondansetron (ZOFRAN) 8 MG tablet Take 1 tablet (8 mg total) by mouth every 8 (eight) hours as  needed for nausea.  60 tablet  7  . temozolomide (TEMODAR) 100 MG capsule Take 1 capsule (100 mg) by mouth daily.May take on an empty stomach or at bedtime to decrease nausea and vomiting  60 capsule  7   No current facility-administered medications for this encounter.    Physical Findings: The patient is in no acute distress. Patient is alert and oriented.  weight is 190 lb 12.8 oz (86.546 kg). His oral temperature is 98 F (36.7 C). His blood pressure is 114/79 and his pulse is 82. His respiration is 16 and oxygen saturation is 100%. .  No significant changes.  Lab Findings: Lab Results  Component Value Date   WBC 5.8 11/07/2013   HGB 16.3 11/07/2013   HCT 48.1 11/07/2013   MCV 88.8 11/07/2013   PLT 204 11/07/2013    @LASTCHEM @  Radiographic Findings: **ADDENDUM** CREATED: 2013-11-07 19:44:42 The patients exams including most recent outside exam of 10-08-2013 were reviewed at the Multidisciplinary Brain Tumor Conference (with Dr. Tammi Klippel on 11-07-2013. There has been a distinct change in the appearance of the T2/FLAIR altered signal posterior to the right frontal lobe resection site now with a 1 cm area of enhancement and T2 FLAIR signal extending along the medial right frontal lobe suspicious  for tumor, Delayed post therapy effect is a less likely consideration. **END ADDENDUM** SIGNED BY: Doug Sou, M.D.    Signed by Doug Sou, MD on 11/07/2013 7:44 PM        Narrative        *RADIOLOGY REPORT*  Clinical Data: Restaging astrocytoma post surgery and radiation therapy.  MRI HEAD WITHOUT AND WITH CONTRAST  Technique: Multiplanar, multiecho pulse sequences of the brain and surrounding structures were obtained according to standard protocol without and with intravenous contrast  Contrast: 58mL MULTIHANCE GADOBENATE DIMEGLUMINE 529 MG/ML IV SOLN  Comparison: Several prior exams, most recent is an outside examination performed 11/27/2012.  Findings: Post  resection anterior right frontal lobe. Surrounding the resection cavity is peripheral enhancement and T2 altered signal intensity along the posterior margin. T2 altered signal intensity extends along the genu of the corpus callosum and in a left frontal horn periventricular position. Overall appearance is similar to the prior exam.  No acute infarct.  No intracranial hemorrhage.  Major intracranial vascular structures are patent.  Interval development of moderate polypoid opacification right maxillary sinus. Minimal mucosal thickening ethmoid sinus air cells and frontal sinuses.  IMPRESSION: Post resection anterior right frontal lobe. Relatively similar appearance of post-therapy changes when compared to the 11/27/2012 outside MR as noted above.  Interval development of moderate mucosal thickening right maxillary sinus.   Original Report Authenticated By: Genia Del, M.D.     Outside MRI's from [place] were loaded into PACS for comparision. They appear to show an enlarging enhancing area posterior to the cavity as shown in the Sep and Dec scans below:   Impression:  The patient has new findings on his MRI as above suggestive of recurrent tumor vs. Adverse radiation effect.  Further imaging or resection may be warranted.  Plan:  He will see Dr. Humphrey Rolls and neuro-oncology at [facility] today.  Once we have a consensus about the next step, we will coordinate a plan.  _____________________________________  Sheral Apley. Tammi Klippel, M.D.

## 2013-11-09 ENCOUNTER — Other Ambulatory Visit: Payer: Self-pay | Admitting: *Deleted

## 2013-11-09 ENCOUNTER — Other Ambulatory Visit: Payer: PRIVATE HEALTH INSURANCE

## 2013-11-09 ENCOUNTER — Telehealth: Payer: Self-pay | Admitting: Oncology

## 2013-11-09 ENCOUNTER — Ambulatory Visit (HOSPITAL_BASED_OUTPATIENT_CLINIC_OR_DEPARTMENT_OTHER): Payer: No Typology Code available for payment source | Admitting: Oncology

## 2013-11-09 ENCOUNTER — Ambulatory Visit
Admission: RE | Admit: 2013-11-09 | Discharge: 2013-11-09 | Disposition: A | Discharge status: Home / Self Care | Payer: PRIVATE HEALTH INSURANCE | Source: Ambulatory Visit | Attending: Radiation Oncology | Admitting: Radiation Oncology

## 2013-11-09 ENCOUNTER — Encounter: Payer: Self-pay | Admitting: Oncology

## 2013-11-09 ENCOUNTER — Encounter: Payer: Self-pay | Admitting: Radiation Oncology

## 2013-11-09 VITALS — BP 114/79 | HR 82 | Temp 98.0°F | Resp 16 | Wt 190.8 lb

## 2013-11-09 VITALS — BP 142/89 | HR 80 | Temp 98.1°F | Resp 18 | Ht 69.0 in | Wt 190.8 lb

## 2013-11-09 DIAGNOSIS — C719 Malignant neoplasm of brain, unspecified: Secondary | ICD-10-CM

## 2013-11-09 DIAGNOSIS — Z923 Personal history of irradiation: Secondary | ICD-10-CM

## 2013-11-09 DIAGNOSIS — C711 Malignant neoplasm of frontal lobe: Secondary | ICD-10-CM

## 2013-11-09 NOTE — Progress Notes (Signed)
OFFICE PROGRESS NOTE  CC Dr. 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 In June 2013 patient had developed headaches and an MRI showed 5.0 x 5.0 cm mass. He was seen at [facility] by Dr. Armandina Stammer and underwent a resection. He subsequently was referred to [place] and has seen Dr. Ledon Snare for consideration of radiation.  #2 patient was recently seen by [facility] and they have recommended he proceed with adjuvant Temodar at 200 mg per meter squared which would be a total dose of 400 mg daily on a daily 1 through 5 basis on a 28 day cycle. He will begin this as soon as he can get his prescriptions filled.  CURRENT THERAPY: adjuvant Temodar at 200 mg per meter squared days one through 5 on every 28 day cycle.  INTERVAL HISTORY: Brian Hanson 33 y.o. male returns for followup visit patient returns in followup. He returned from [place] a few weeks ago. Overall patient has done well. He was in [place] for at least about 7 months. He has completed all of his Temodar in December 2014. Clinically he tells me he is feeling much better. He is denying any fevers chills night sweats shortness of breath chest pains palpitations he has no headaches double vision blurring of vision no pains. No weakness. Remainder of the 10 point review of systems is negative. MEDICAL HISTORY: Past Medical History  Diagnosis Date  . Brain tumor   . 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 Pneumonia 04/2012  . Seizures 09/2010  . Rhabdomyolysis     s/p hospitalization 09/2010  . History of hemodialysis 09/2010    x 3 s/p renal  failure  . GERD (gastroesophageal reflux disease)   . Anxiety   . Astrocytoma brain tumor   . Hx of radiation therapy 07/13/12 -08/27/12    right frontal brain    ALLERGIES:  is allergic to milk-related compounds and dilantin.  MEDICATIONS:  Current Outpatient Prescriptions  Medication Sig Dispense Refill  . CALCIUM PO Take by mouth.      . dexamethasone (DECADRON) 4 MG tablet Take 4 mg by mouth 2 (two) times daily with a meal. Take 1 tablet every 8 hours.      Marland Kitchen emollient (BIAFINE) cream Apply 1 application topically 3 (three) times daily. To forehead.      . hydrocodone-acetaminophen (LORCET-HD) 5-500 MG per capsule Take 1 capsule by mouth every 6 (six) hours as needed for pain.  30 capsule  0  . levETIRAcetam (KEPPRA) 500 MG tablet Take 500 mg by mouth 2 (two) times daily.       Marland Kitchen LORazepam (ATIVAN) 0.5 MG tablet Take 1 tablet (0.5 mg total) by mouth every 8 (eight) hours.  30 tablet  0  . metFORMIN (GLUCOPHAGE) 500 MG tablet Take 1 tablet (500 mg total) by mouth daily with breakfast.  14 tablet  0  . montelukast (SINGULAIR) 10 MG tablet Take 10 mg by mouth at bedtime.      . ondansetron (ZOFRAN) 8 MG tablet Take 1 tablet (8 mg total) by mouth every 8 (eight) hours as needed for nausea.  60 tablet  7  .  sulfamethoxazole-trimethoprim (BACTRIM DS,SEPTRA DS) 800-160 MG per tablet Take 1 tablet by mouth 2 (two) times daily.  60 tablet  6  . temozolomide (TEMODAR) 100 MG capsule Take 1 capsule (100 mg) by mouth daily.May take on an empty stomach or at bedtime to decrease nausea and vomiting  60 capsule  7   No current facility-administered medications for this visit.    SURGICAL HISTORY:  Past Surgical History  Procedure Laterality Date  . Brain surgery  10/22/2010    [place]  . Craniotomy  05/30/12    Dr Derrill Memo, [facility]    REVIEW OF SYSTEMS:   General: fatigue (-), night sweats (-), fever (-), pain (-) Lymph: palpable nodes (-) HEENT: vision changes (-), mucositis (-), gum  bleeding (-), epistaxis (-) Cardiovascular: chest pain (-), palpitations (-) Pulmonary: shortness of breath (-), dyspnea on exertion (-), cough (-), hemoptysis (-) GI:  Early satiety (-), melena (-), dysphagia (-), nausea/vomiting (-), diarrhea (-) GU: dysuria (-), hematuria (-), incontinence (-) Musculoskeletal: joint swelling (-), joint pain (-), back pain (-) Neuro: weakness (-), numbness (-), headache (-), confusion (-) Skin: Rash (-), lesions (-), dryness (-) Psych: depression (-), suicidal/homicidal ideation (-), feeling of hopelessness (-)   PHYSICAL EXAMINATION: Blood pressure 142/89, pulse 80, temperature 98.1 F (36.7 C), temperature source Oral, resp. rate 18, height 5\' 9"  (1.753 m), weight 190 lb 12.8 oz (86.546 kg). Body mass index is 28.16 kg/(m^2). General: Patient is a well appearing male in no acute distress HEENT: PERRLA, sclerae anicteric no conjunctival pallor, MMM Neck: supple, no palpable adenopathy Lungs: clear to auscultation bilaterally, no wheezes, rhonchi, or rales Cardiovascular: regular rate rhythm, S1, S2, no murmurs, rubs or gallops Abdomen: Soft, non-tender, non-distended, normoactive bowel sounds, no HSM Extremities: warm and well perfused, no clubbing, cyanosis, or edema Skin: No rashes or lesions Neuro: Non-focal, CN II-XII intact ECOG PERFORMANCE STATUS: 1 - Symptomatic but completely ambulatory      LABORATORY DATA: Lab Results  Component Value Date   WBC 5.8 11/07/2013   HGB 16.3 11/07/2013   HCT 48.1 11/07/2013   MCV 88.8 11/07/2013   PLT 204 11/07/2013      Chemistry      Component Value Date/Time   NA 140 11/07/2013 1124   NA 140 02/18/2013 1916   K 3.8 11/07/2013 1124   K 3.6 02/18/2013 1916   CL 106 02/21/2013 1126   CL 106 02/18/2013 1916   CO2 26 11/07/2013 1124   CO2 25 10/06/2012 2204   BUN 12.5 11/07/2013 1124   BUN 12 02/18/2013 1916   CREATININE 0.9 11/07/2013 1124   CREATININE 0.90 02/18/2013 1916      Component Value Date/Time    CALCIUM 9.3 11/07/2013 1124   CALCIUM 9.1 10/06/2012 2204   ALKPHOS 99 11/07/2013 1124   ALKPHOS 130* 10/06/2012 2204   AST 17 11/07/2013 1124   AST 18 10/06/2012 2204   ALT 18 11/07/2013 1124   ALT 62* 10/06/2012 2204   BILITOT 0.49 11/07/2013 1124   BILITOT 0.6 10/06/2012 2204      RADIOGRAPHIC STUDIES:   ASSESSMENT/PLAN: 33 year old gentleman with 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 Patient underwent resection followed by concurrent radiation and Temodar. He then started Temodar adjuvantly and 200 mg per meter squared for a total of 400 mg given days 1 through 5 on a 28 day cycle. He was given all of his medications and he took most of his adjuvant therapy in [place]. Patient is now  status post 12 cycles of Temodar completed December 2014. He is seen by Dr. Tyler Pita there is noted to be on recent MRI and increasing lesion. Patient is also being seen at [facility] this afternoon. Question would is whether to resect this area or to continue observation. We will see what the decision is from [facility]. Whether or not patient needs to continue any further systemic treatment will also be made by his neuro-oncologist at [facility]. I will followup on their notes. In the meantime I have set the patient up to see me back in early September 2015. Patient again is planning on going back about what they should for the spring and summer months.    All questions were answered. The patient knows to call the clinic with any problems, questions or concerns. We can certainly see the patient much sooner if necessary.  I spent 15 minutes counseling the patient face to face. The total time spent in the appointment was 30 minutes.  Marcy Panning, MD Medical/Oncology [facility] [phone] (beeper) [phone] (Office)  11/09/2013, 3:34 PM

## 2013-11-09 NOTE — Progress Notes (Signed)
Brother reports that the patient's appetite has decreased however, weight remains stable. Patient reports left sided occipital headaches have resolved. Patient denies daily headaches. Reports occasional blurry vision after working long hours on the computer. Denies ringing in the ears, nausea, vomiting, or dizziness. Continues keppra 500 bid, no taking decadron or temodar at this time. Reports that his energy level has returned to normal. Concerned about raised lesion behind right ear.

## 2013-11-09 NOTE — Telephone Encounter (Signed)
, °

## 2013-11-10 ENCOUNTER — Ambulatory Visit: Payer: PRIVATE HEALTH INSURANCE | Admitting: Radiation Oncology

## 2013-11-10 ENCOUNTER — Telehealth: Payer: Self-pay | Admitting: *Deleted

## 2013-11-10 NOTE — Telephone Encounter (Signed)
CALLED PATIENT TO INFORM OF TEST, LVM FOR A RETURN CALL 

## 2013-11-10 NOTE — Addendum Note (Signed)
Encounter addended by: Lora Paula, MD on: 11/10/2013 11:29 AM<BR>     Documentation filed: Orders

## 2013-11-10 NOTE — Telephone Encounter (Signed)
CALLED PATIENT TO INFORM OF DR. MANNING'S DECISION TO LEAVE PET SCAN FOR 11-23-13 AND TO MOVE THE [facility] APPT., SPOKE WITH PATIENT'S BROTHER AND HE IS AWARE OF THIS

## 2013-11-18 ENCOUNTER — Other Ambulatory Visit: Payer: Self-pay | Admitting: Radiation Therapy

## 2013-11-18 DIAGNOSIS — C719 Malignant neoplasm of brain, unspecified: Secondary | ICD-10-CM

## 2013-11-22 ENCOUNTER — Ambulatory Visit (HOSPITAL_COMMUNITY)
Admission: RE | Admit: 2013-11-22 | Discharge: 2013-11-22 | Disposition: A | Discharge status: Home / Self Care | Payer: No Typology Code available for payment source | Source: Ambulatory Visit | Attending: Radiation Oncology | Admitting: Radiation Oncology

## 2013-11-22 DIAGNOSIS — C719 Malignant neoplasm of brain, unspecified: Secondary | ICD-10-CM

## 2013-11-22 DIAGNOSIS — Z923 Personal history of irradiation: Secondary | ICD-10-CM | POA: Insufficient documentation

## 2013-11-22 MED ORDER — GADOBENATE DIMEGLUMINE 529 MG/ML IV SOLN
18.0000 mL | Freq: Once | INTRAVENOUS | Status: AC | PRN
  Administered 2013-11-22: 18 mL via INTRAVENOUS

## 2013-11-23 ENCOUNTER — Ambulatory Visit (HOSPITAL_COMMUNITY): Payer: No Typology Code available for payment source

## 2013-11-24 ENCOUNTER — Encounter (HOSPITAL_COMMUNITY)
Admission: RE | Admit: 2013-11-24 | Discharge: 2013-11-24 | Disposition: A | Discharge status: Home / Self Care | Payer: No Typology Code available for payment source | Source: Ambulatory Visit | Attending: Radiation Oncology | Admitting: Radiation Oncology

## 2013-11-24 DIAGNOSIS — Z923 Personal history of irradiation: Secondary | ICD-10-CM | POA: Insufficient documentation

## 2013-11-24 DIAGNOSIS — Z9221 Personal history of antineoplastic chemotherapy: Secondary | ICD-10-CM | POA: Insufficient documentation

## 2013-11-24 DIAGNOSIS — C719 Malignant neoplasm of brain, unspecified: Secondary | ICD-10-CM

## 2013-11-24 DIAGNOSIS — G9389 Other specified disorders of brain: Secondary | ICD-10-CM | POA: Insufficient documentation

## 2013-11-24 MED ORDER — FLUDEOXYGLUCOSE F - 18 (FDG) INJECTION
10.4000 | Freq: Once | INTRAVENOUS | Status: AC | PRN
  Administered 2013-11-24: 10.4 via INTRAVENOUS

## 2013-11-27 ENCOUNTER — Encounter: Payer: Self-pay | Admitting: Radiation Oncology

## 2013-11-27 NOTE — Progress Notes (Signed)
Radiation Oncology         [phone] ________________________________  Name: Brian Hanson MRN: [id number]  Date: 11/28/2013  DOB: 1981/08/19  Multidisciplinary Neuro Oncology Clinic Follow-Up Visit Note  CC: Default, Provider, MD  No ref. provider found  Diagnosis:   33 year old gentleman status post resection of a grade 3 anaplastic astrocytoma in the right frontal brain s/p Adjuvant, Local Control, Curative 07/13/2012-08/27/2012 treated to 59.4 gray in 33 fractions with concurrent Temodar  Interval Since Last Radiation:  14 months  Narrative:  The patient returns today for routine follow-up.  The recent films were presented in our multidisciplinary conference with neuroradiology just prior to the clinic.  He underwent brain PET fused with the MRI in order to evaluate a new discrete enhancing focus posterior to his previous resection cavity in the right frontal lobe.  This study is suggestive of recurrent disease.  He remains asymptomatic.                              ALLERGIES:  is allergic to milk-related compounds and dilantin.  Meds: Current Outpatient Prescriptions  Medication Sig Dispense Refill  . calcium gluconate (CALCIUM GLUCONATE) 100 mg/mL SOLN Take by mouth.      . levETIRAcetam (KEPPRA) 500 MG tablet Take by mouth.      . sulfamethoxazole-trimethoprim (BACTRIM DS,SEPTRA DS) 800-160 MG per tablet Take 1 tablet by mouth 2 (two) times daily.  60 tablet  6  . ondansetron (ZOFRAN) 8 MG tablet Take 1 tablet (8 mg total) by mouth every 8 (eight) hours as needed for nausea.  60 tablet  7   No current facility-administered medications for this encounter.    Physical Findings: The patient is in no acute distress. Patient is alert and oriented.  weight is 191 lb 11.2 oz (86.955 kg). His oral temperature is 98 F (36.7 C). His blood pressure is 116/84 and his pulse is 73. His respiration is 16 and oxygen saturation is 100%. .  No significant changes.  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  EXAM: MRI HEAD WITHOUT AND WITH CONTRAST  TECHNIQUE: Multiplanar, multiecho pulse sequences of the brain and surrounding structures were obtained without and with intravenous contrast.  CONTRAST:  18mL MULTIHANCE GADOBENATE DIMEGLUMINE 529 MG/ML IV SOLN  COMPARISON:  Outside brain imaging 10/08/2013  FINDINGS: Calvarium and upper cervical spine: No marrow signal abnormality. Unremarkable appearance of right frontal craniotomy.  Orbits: No significant findings.  Sinuses: Essentially clear. Mastoid and middle ears are clear.  Brain:  There is a CSF intensity cavity in the right frontal lobe, the site of resected tumor. Initial imaging not available to assess the characteristics of the untreated tumor, although there was presumably enhancement based on grade 3 status. There is long-standing thin peripheral enhancement around the surgical cavity which is consider postoperative. T2 and FLAIR signal hyperintensity present around the cavity also extending posteriorly from the cavity in the parasagittal right frontal lobe, centered on the cingulate gyrus. These changes are stable from prior but have progress from more remote imaging, including 04/27/2013. There is mild local mass  effect with sulcal effacement.  There is an approximately 10 mm area of nodular enhancement along the posterior margin of the cavity, within the subcortical white matter. Although the size is relatively similar compared to imaging when it was initially seen (04/27/2013), the enhancing abnormality appears slightly more bulky, with more finger-like extensions. Differences in imaging technique could affect this  finding. The area of enhancement shows diffusion hyperintensity with somewhat peripheral ADC hypo intensity.  No acute abnormality such as acute infarct, hemorrhage, hydrocephalus, or shift. No evidence of large vessel occlusion.  IMPRESSION: 1. 11 mm enhancing nodule posterior to the resection cavity has mildly enlarged since development April 27, 2013. The nodule is indeterminate between tumor and radiation effects, although associated restricted diffusion is more concerning for cellular tumor. 2. Slowly progressive nonenhancing signal abnormality in the parafalcine right frontal lobe, posterior to the cavity. Associated mass effect is concerning for tumor infiltration in this region.   Electronically Signed   By: Tiburcio Pea M.D.   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 Evaluate for recurrence versus radionecrosis.  EXAM: NM PET METABOLIC BRAIN  TECHNIQUE: 10.4 MCi F-18 FDG was injected intravenously. Full-ring PET imaging was performed from the vertex to the skull base. CT data was obtained and used for attenuation correction and anatomic localization only. (This was not acquired as a diagnostic CT examination.) Data set was fused with the MRI from 11/22/2013 for localization of enhancing brain parenchyma.  FINDINGS: Correlating the FDG PET data set to the brain MRI, there is a large photopenic space within the right frontal lobe correlating with the cystic resection cavity.  Along the posterior margin of the resection cavity, there is a small focus of low metabolic activity within the white matter which corresponds to the enhancing lesion on comparison MRI. This is faintly seen on PET axial image 31.  There is no clear abnormal metabolic activity associated with the parafalcine thickening seen more inferiorly in the right frontal lobe described on comparison MRI.  IMPRESSION: Mild metabolic white matter  activity, correlating to the enhancing lesion within the right frontal lobe on comparison MRI, is concerning for tumor recurrence.  Findings conveyed toMATTHEW Bertha Earwood on 11/25/2013  at12:30.  : FASTING BLOOD GLUCOSE:  Not not measured  COMPARISON:  MR HEAD WO/W CM dated 11/22/2013; MR OUTSIDE FILMS HEAD/FACE dated 10/08/2013; MR OUTSIDE FILMS HEAD/FACE dated 07/03/2013   Electronically Signed   By: Genevive Bi M.D.   On: 11/25/2013 12:30    Impression:  The patient has a distinct focus of enhancement adjacent to his surgical site potentially representing tumor recurrence as suggested by PET.  Plan:  There are a variety of potential options at this time including salvage resection, SRS, Avastin, empiric radionecrosis therapy, or other options.  Given the lack of absolute confirmation that this represents recurrence, I would have some reluctance to consider SRS or even Avastin.  Avastin does have some cross functionality in treating radionecrosis as well.  But, in this setting, I would likely favor surgical resection to document recurrence, and if the tumor is IDH1 positive as previously noted, there is some suggested benefit to surgical resection.  _____________________________________  Artist Pais Kathrynn Running, M.D.

## 2013-11-28 ENCOUNTER — Ambulatory Visit
Admission: RE | Admit: 2013-11-28 | Discharge: 2013-11-28 | Disposition: A | Discharge status: Home / Self Care | Payer: No Typology Code available for payment source | Source: Ambulatory Visit | Attending: Radiation Oncology | Admitting: Radiation Oncology

## 2013-11-28 ENCOUNTER — Encounter: Payer: Self-pay | Admitting: Radiation Oncology

## 2013-11-28 VITALS — BP 116/84 | HR 73 | Temp 98.0°F | Resp 16 | Wt 191.7 lb

## 2013-11-28 DIAGNOSIS — C719 Malignant neoplasm of brain, unspecified: Secondary | ICD-10-CM

## 2013-11-28 MED ORDER — LEVETIRACETAM 500 MG PO TABS: 500.0000 mg | ORAL_TABLET | Freq: Two times a day (BID) | ORAL | Status: AC

## 2013-11-28 NOTE — Progress Notes (Signed)
Requesting refill of Keppra. Denies recent seizure activity. Denies headache, dizziness, nausea or vomiting. Denies diplopia or ringing in the ears. Denies fatigue. Weight stable. Denies taking decadron at this time.

## 2013-11-30 ENCOUNTER — Other Ambulatory Visit (HOSPITAL_COMMUNITY): Payer: No Typology Code available for payment source

## 2013-11-30 ENCOUNTER — Telehealth: Payer: Self-pay | Admitting: Radiation Oncology

## 2013-11-30 ENCOUNTER — Other Ambulatory Visit: Payer: Self-pay | Admitting: Oncology

## 2013-11-30 ENCOUNTER — Encounter: Payer: Self-pay | Admitting: Oncology

## 2013-11-30 MED ORDER — TEMOZOLOMIDE 100 MG PO CAPS: ORAL_CAPSULE | ORAL | Status: AC

## 2013-11-30 NOTE — Telephone Encounter (Signed)
Returned patient's call. Patient reports that he thought he lost his Keppra script but, has since found it. Denies needs at this time.

## 2013-11-30 NOTE — Progress Notes (Signed)
The brother Sheryle Spray came in to see if the patient will still be on Temodar. I sent an email to Dr. Humphrey Rolls. I advised Sheryle Spray will let him know what she says. He said a company [phone] was the one they used last time.

## 2013-12-01 ENCOUNTER — Other Ambulatory Visit: Payer: Self-pay | Admitting: *Deleted

## 2013-12-01 ENCOUNTER — Telehealth: Payer: Self-pay | Admitting: *Deleted

## 2013-12-01 ENCOUNTER — Other Ambulatory Visit: Payer: Self-pay | Admitting: Oncology

## 2013-12-01 MED ORDER — TEMOZOLOMIDE 100 MG PO CAPS
100.0000 mg | ORAL_CAPSULE | Freq: Every day | ORAL | Status: AC
Start: 2013-12-01 — End: ?

## 2013-12-01 NOTE — Telephone Encounter (Signed)
Received call from pt requesting a new prescription with new dosage for Temodar.  Pt stated he was seen at [facility] on Tuesday 11/29/13; pt was told that the recommendations would be for a lower dosage of Temodar.   Pt stated he completed last Temodar 400mg  daily - day 1 - 5  Of every 28 days  On   10/14/13.  Called [facility] and spoke with [facility] @ Dr. Fonda Kinder office.  Asked Vaughan Basta to fax notes from md to our office for Dr. Humphrey Rolls to review. Informed pt that message will be left on Dr. Laurelyn Sickle desk for review; and her nurse will contact pt with further instructions from md.  Pt voiced understanding. Pt's  Phone    [phone]. Dr. Rica Mast   Phone    [phone]    ;     Fax      [phone].

## 2013-12-01 NOTE — Telephone Encounter (Signed)
Received faxed note from [facility] @ Dr. Derrill Memo stating that pt saw Dr. Courtney Paris at [facility] on Tues. 11/29/13.   Called Dr. Alvina Chou office and left message for office notes to be faxed to our office for Dr. Humphrey Rolls to review. Dr.  Suzanna Obey   Phone    [phone].

## 2013-12-06 ENCOUNTER — Other Ambulatory Visit: Payer: Self-pay | Admitting: *Deleted

## 2013-12-06 DIAGNOSIS — C719 Malignant neoplasm of brain, unspecified: Secondary | ICD-10-CM

## 2013-12-06 DIAGNOSIS — R569 Unspecified convulsions: Secondary | ICD-10-CM

## 2013-12-06 MED ORDER — ONDANSETRON HCL 8 MG PO TABS: 8.0000 mg | ORAL_TABLET | Freq: Three times a day (TID) | ORAL | Status: AC | PRN

## 2013-12-15 ENCOUNTER — Other Ambulatory Visit: Payer: Self-pay | Admitting: Oncology

## 2014-06-16 ENCOUNTER — Telehealth: Payer: Self-pay | Admitting: Hematology

## 2014-06-16 NOTE — Telephone Encounter (Signed)
, °

## 2014-06-28 ENCOUNTER — Telehealth: Payer: Self-pay | Admitting: *Deleted

## 2014-06-28 NOTE — Telephone Encounter (Signed)
Received VM from pt's Brother states pt recently passed away.  Please cancel any upcoming appts.

## 2014-06-29 ENCOUNTER — Other Ambulatory Visit: Payer: PRIVATE HEALTH INSURANCE

## 2014-06-29 ENCOUNTER — Ambulatory Visit: Payer: PRIVATE HEALTH INSURANCE | Admitting: Oncology

## 2014-06-29 IMAGING — CT CT ANGIO CHEST
1 of 2 series · 19 of 32 positions shown · IV contrast (OMNIPAQUE 300)
Comparison: Chest radiographs dated 08/13/2012

CLINICAL DATA: Shortness of breath, evaluate for PE

CT ANGIOGRAPHY CHEST
TECHNIQUE: Multidetector CT imaging of the chest using the
standard protocol during bolus administration of intravenous
contrast. Multiplanar reconstructed images including MIPs were
obtained and reviewed to evaluate the vascular anatomy.
Contrast: 100mL OMNIPAQUE IOHEXOL 350 MG/ML SOLN

[Series 6: thins for pacs · axial · 0.66mm/px · z∈[+1456,+1648]mm · 19 of 214 slices shown]
[im 11/214  lung]
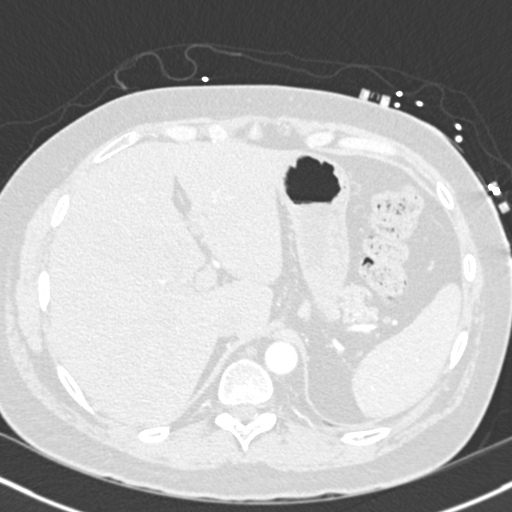
[im 22/214  mediastinal]
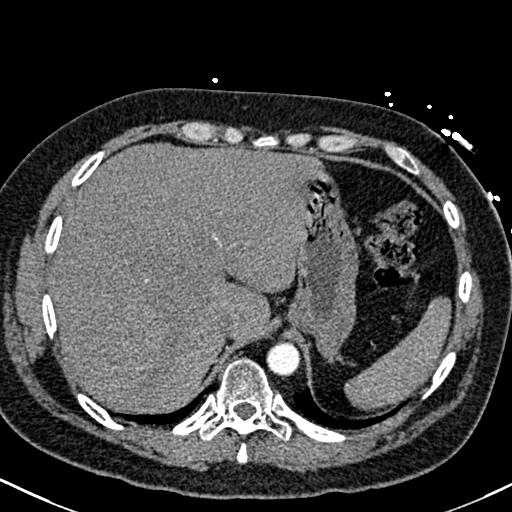
[im 32/214  lung]
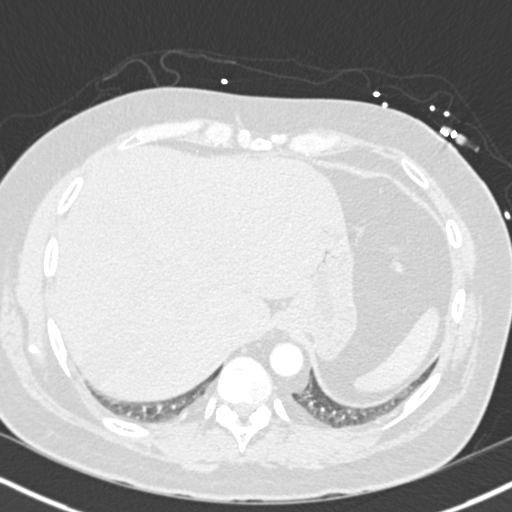
[im 54/214  mediastinal]
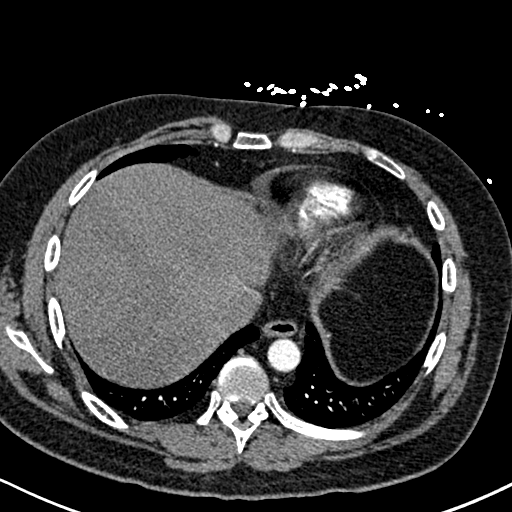
[im 64/214  lung]
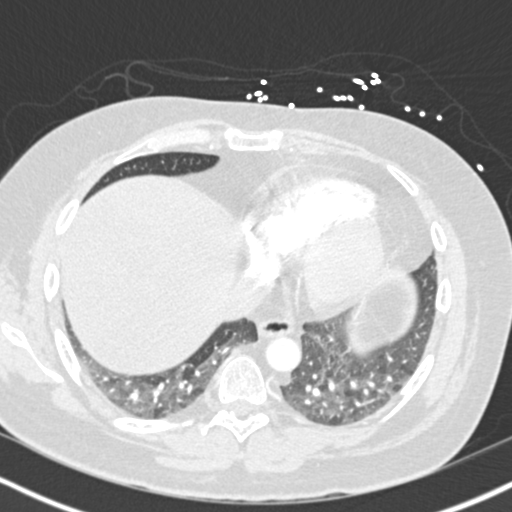
[im 72/214  mediastinal]
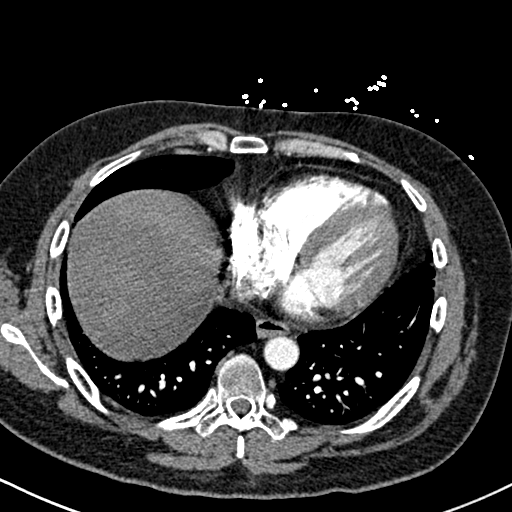
[im 75/214  lung]
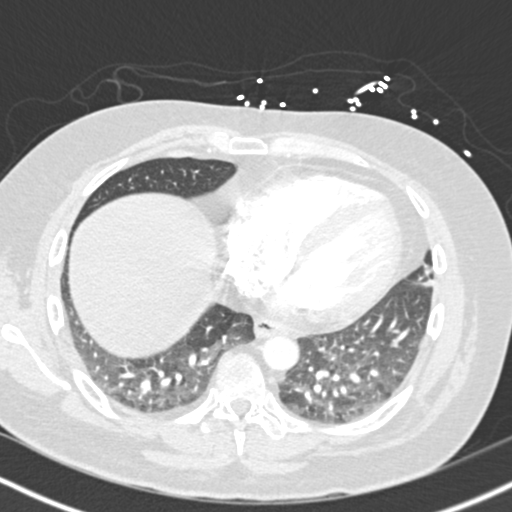
[im 86/214  mediastinal]
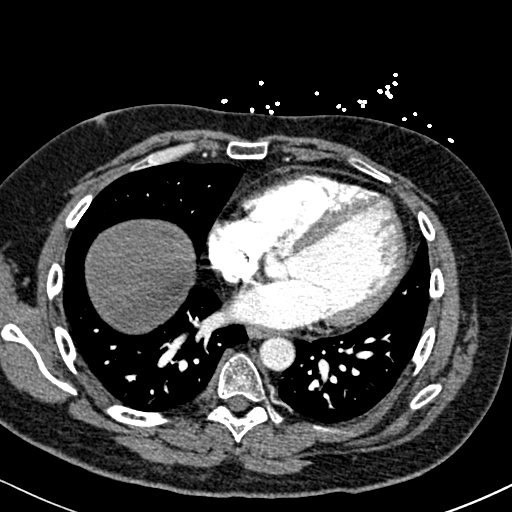
[im 96/214  lung]
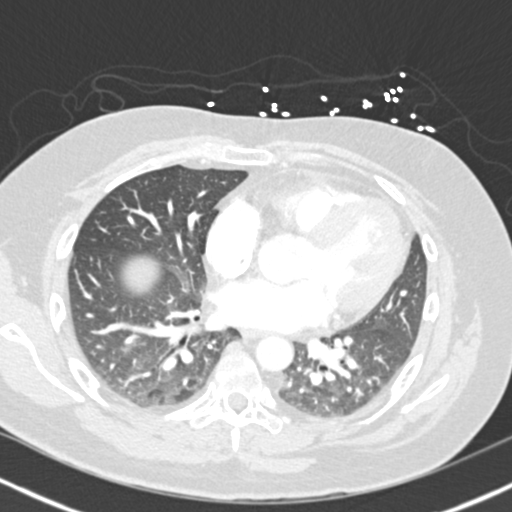
[im 107/214  mediastinal]
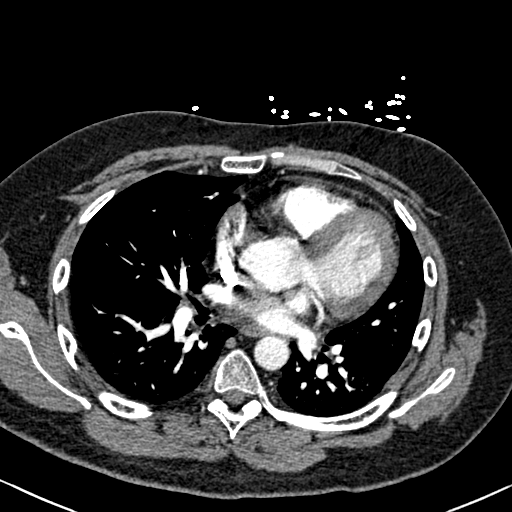
[im 118/214  lung]
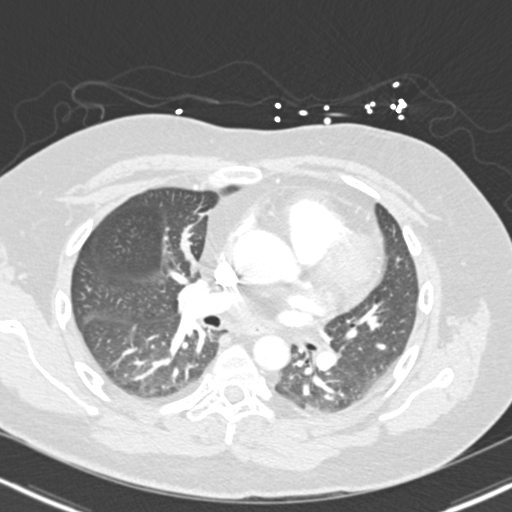
[im 128/214  mediastinal]
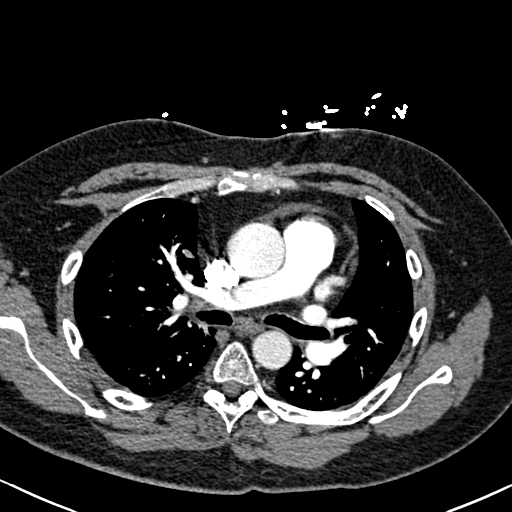
[im 139/214  lung]
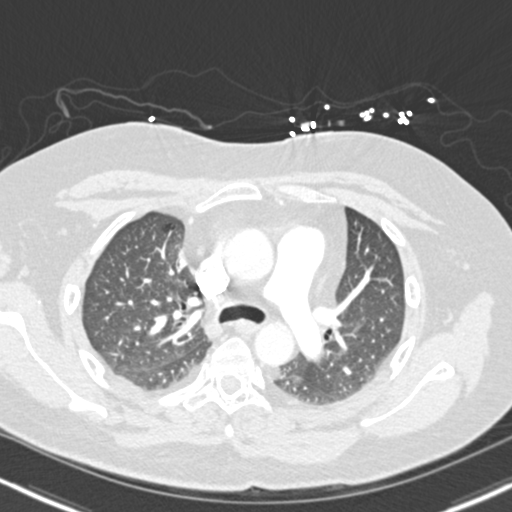
[im 143/214  mediastinal]
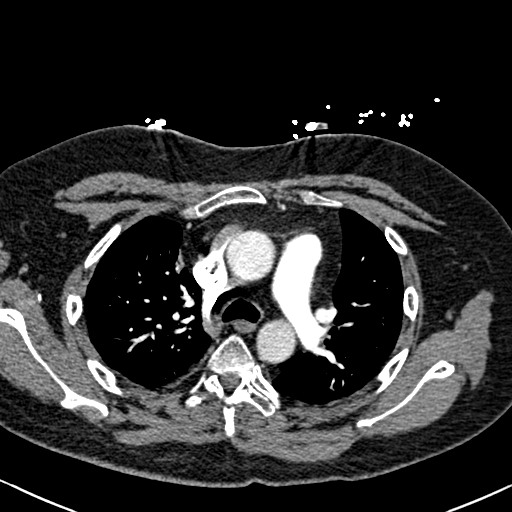
[im 150/214  lung]
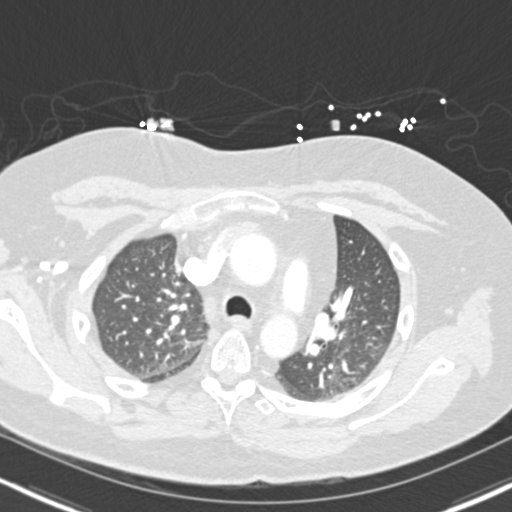
[im 160/214  mediastinal]
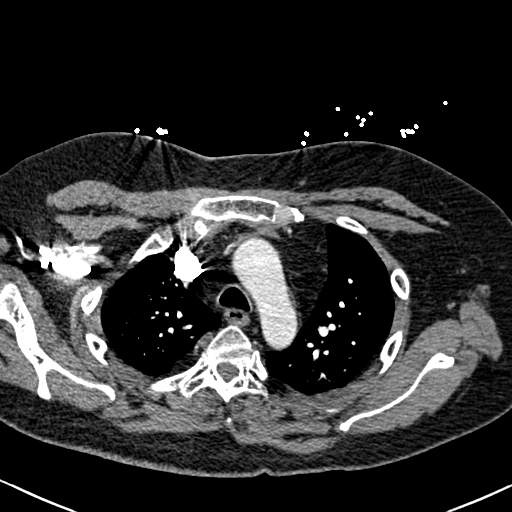
[im 182/214  lung]
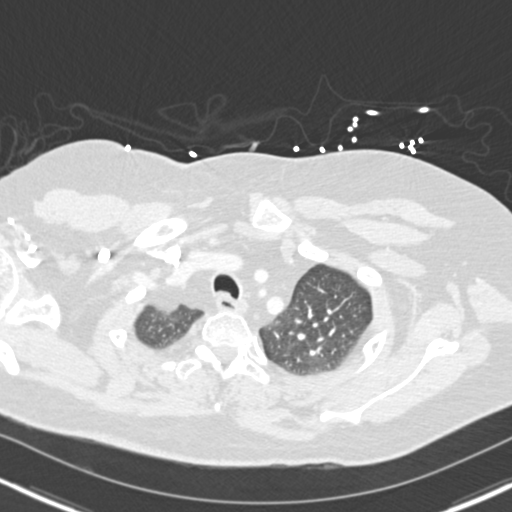
[im 192/214  mediastinal]
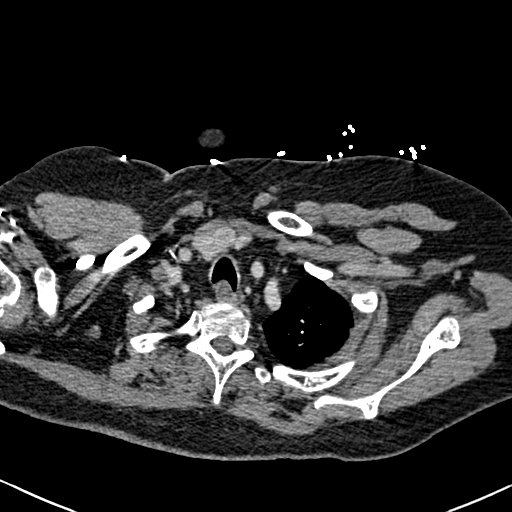
[im 203/214  lung]
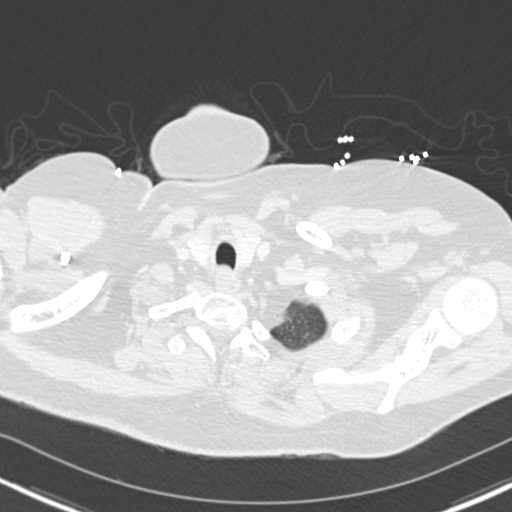

[19 of 32 positions shown; findings below may reference images not displayed]

FINDINGS: No evidence of pulmonary embolism.

Lungs are essentially clear.  Mild bilateral lower lobe
atelectasis.  No suspicious pulmonary nodules. No pleural effusion
or pneumothorax.

Visualized thyroid is unremarkable.

The heart is normal in size.  No pericardial effusion.

No suspicious mediastinal, hilar, or axillary lymphadenopathy.

Visualized upper abdomen is notable for suspected moderate
geographic hepatic steatosis (series 4/image 3).

Visualized osseous structures are within normal limits.
IMPRESSION: No evidence of pulmonary embolism.

No evidence of acute cardiopulmonary disease.

Suspected hepatic steatosis.

## 2014-07-13 ENCOUNTER — Ambulatory Visit: Payer: Self-pay

## 2014-07-13 ENCOUNTER — Other Ambulatory Visit: Payer: No Typology Code available for payment source

## 2014-08-16 ENCOUNTER — Ambulatory Visit: Payer: No Typology Code available for payment source | Admitting: Radiation Oncology

## 2014-09-27 ENCOUNTER — Telehealth: Payer: Self-pay | Admitting: *Deleted

## 2014-09-27 NOTE — Telephone Encounter (Signed)
Received call asking for call back in reference to a medication question. Called and left message to return call.
